# Patient Record
Sex: Female | Born: 1945 | Race: White | Hispanic: No | Smoking: Never smoker
Health system: Southern US, Community
[De-identification: ages and names within clinical notes are randomized; demographics above are authoritative.]

## PROBLEM LIST (undated history)

## (undated) DIAGNOSIS — C4359 Malignant melanoma of other part of trunk: Secondary | ICD-10-CM

## (undated) DIAGNOSIS — E785 Hyperlipidemia, unspecified: Secondary | ICD-10-CM

## (undated) DIAGNOSIS — H269 Unspecified cataract: Secondary | ICD-10-CM

## (undated) DIAGNOSIS — M810 Age-related osteoporosis without current pathological fracture: Secondary | ICD-10-CM

## (undated) HISTORY — DX: Age-related osteoporosis without current pathological fracture: M81.0

## (undated) HISTORY — DX: Unspecified cataract: H26.9

## (undated) HISTORY — DX: Hyperlipidemia, unspecified: E78.5

## (undated) HISTORY — DX: Malignant melanoma of other part of trunk: C43.59

---

## 1994-06-24 DIAGNOSIS — C4359 Malignant melanoma of other part of trunk: Secondary | ICD-10-CM

## 1994-06-24 HISTORY — DX: Malignant melanoma of other part of trunk: C43.59

## 1994-06-24 HISTORY — PX: MELANOMA EXCISION: SHX5266

## 1998-06-01 ENCOUNTER — Ambulatory Visit (HOSPITAL_COMMUNITY): Admission: RE | Admit: 1998-06-01 | Discharge: 1998-06-01 | Payer: Self-pay | Admitting: Gastroenterology

## 1999-04-27 ENCOUNTER — Encounter: Payer: Self-pay | Admitting: Emergency Medicine

## 1999-04-27 ENCOUNTER — Encounter: Admission: RE | Admit: 1999-04-27 | Discharge: 1999-04-27 | Payer: Self-pay | Admitting: Emergency Medicine

## 1999-11-12 ENCOUNTER — Encounter: Payer: Self-pay | Admitting: Emergency Medicine

## 1999-11-12 ENCOUNTER — Encounter: Admission: RE | Admit: 1999-11-12 | Discharge: 1999-11-12 | Payer: Self-pay | Admitting: Emergency Medicine

## 1999-11-18 ENCOUNTER — Emergency Department (HOSPITAL_COMMUNITY): Admission: EM | Admit: 1999-11-18 | Discharge: 1999-11-18 | Payer: Self-pay | Admitting: *Deleted

## 2000-11-12 ENCOUNTER — Encounter: Payer: Self-pay | Admitting: Emergency Medicine

## 2000-11-12 ENCOUNTER — Encounter: Admission: RE | Admit: 2000-11-12 | Discharge: 2000-11-12 | Payer: Self-pay | Admitting: Emergency Medicine

## 2001-11-13 ENCOUNTER — Encounter: Payer: Self-pay | Admitting: Emergency Medicine

## 2001-11-13 ENCOUNTER — Encounter: Admission: RE | Admit: 2001-11-13 | Discharge: 2001-11-13 | Payer: Self-pay | Admitting: Emergency Medicine

## 2002-10-08 ENCOUNTER — Ambulatory Visit (HOSPITAL_COMMUNITY): Admission: RE | Admit: 2002-10-08 | Discharge: 2002-10-08 | Payer: Self-pay | Admitting: Gastroenterology

## 2002-10-08 ENCOUNTER — Encounter (INDEPENDENT_AMBULATORY_CARE_PROVIDER_SITE_OTHER): Payer: Self-pay | Admitting: *Deleted

## 2002-11-15 ENCOUNTER — Encounter: Payer: Self-pay | Admitting: Emergency Medicine

## 2002-11-15 ENCOUNTER — Encounter: Admission: RE | Admit: 2002-11-15 | Discharge: 2002-11-15 | Payer: Self-pay | Admitting: Emergency Medicine

## 2003-11-16 ENCOUNTER — Encounter: Admission: RE | Admit: 2003-11-16 | Discharge: 2003-11-16 | Payer: Self-pay | Admitting: Emergency Medicine

## 2004-11-20 ENCOUNTER — Encounter: Admission: RE | Admit: 2004-11-20 | Discharge: 2004-11-20 | Payer: Self-pay | Admitting: Emergency Medicine

## 2005-12-04 ENCOUNTER — Encounter: Admission: RE | Admit: 2005-12-04 | Discharge: 2005-12-04 | Payer: Self-pay | Admitting: Emergency Medicine

## 2006-12-31 ENCOUNTER — Encounter: Admission: RE | Admit: 2006-12-31 | Discharge: 2006-12-31 | Payer: Self-pay | Admitting: Emergency Medicine

## 2010-11-09 NOTE — Op Note (Signed)
   NAME:  HALO, SHEVLIN                           ACCOUNT NO.:  [id number]   MEDICAL RECORD NO.:  [id number]                   PATIENT TYPE:  AMB   LOCATION:  ENDO                                 FACILITY:  [facility]   PHYSICIAN:  Anselmo Rod, M.D.               DATE OF BIRTH:  [birth year]   DATE OF PROCEDURE:  10/08/2002  DATE OF DISCHARGE:                                 OPERATIVE REPORT   PROCEDURE:  Colonoscopy with snare polypectomy x1.   ENDOSCOPIST:  Anselmo Rod, M.D.   INSTRUMENT USED:  Olympus video colonoscope.   INDICATION FOR PROCEDURE:  A 65 year old white female with a personal  history of adenomatous undergoing a repeat colonoscopy.  Rule out recurrent  polyps.   PREPROCEDURE PREPARATION:  Informed consent was procured from the patient.  The patient was fasted for eight hours prior to the procedure and prepped  with a bottle of magnesium citrate and a gallon of GoLYTELY the night prior  to the procedure.   PREPROCEDURE PHYSICAL:  VITAL SIGNS:  The patient had stable vital signs.  NECK:  Supple.  CHEST:  Clear to auscultation.  S1, S2 regular.  ABDOMEN:  Soft with normal bowel sounds.   DESCRIPTION OF PROCEDURE:  The patient was placed in the left lateral  decubitus position and sedated with 70 mg of Demerol and 7 mg of Versed  intravenously.  Once the patient was adequately sedate and maintained on low-  flow oxygen and continuous cardiac monitoring, the Olympus video colonoscope  was advanced from the rectum to the cecum without difficulty.  A small 5-6  mm sessile polyp was snared from 25 cm.  The rest of the colonic mucosa up  to the cecum appeared healthy.  No other masses, polyps, erosions,  ulcerations, diverticula, or hemorrhoids were seen.   IMPRESSION:  One small sessile polyp snared from 25 cm, otherwise normal  colonoscopy up to the cecum.   RECOMMENDATIONS:  1. Await pathology results.  2.     Avoid all nonsteroidals, including aspirin, for  the next four weeks.  3. Repeat colorectal cancer screening depending on the pathology results,     possibly in the next three to five years.  4. Outpatient follow-up on a p.r.n. basis.                                               Anselmo Rod, M.D.    JNM/MEDQ  D:  10/08/2002  T:  10/09/2002  Job:  [id number]   cc:   Reuben Likes, M.D.  [address]  [place]  [place] [address]  Fax: [phone]

## 2011-10-21 ENCOUNTER — Ambulatory Visit (INDEPENDENT_AMBULATORY_CARE_PROVIDER_SITE_OTHER): Payer: Federal, State, Local not specified - PPO | Admitting: Family Medicine

## 2011-10-21 ENCOUNTER — Encounter: Payer: Self-pay | Admitting: Family Medicine

## 2011-10-21 VITALS — BP 147/84 | HR 97 | Temp 98.3°F | Resp 16 | Ht 66.0 in | Wt 129.0 lb

## 2011-10-21 DIAGNOSIS — Z139 Encounter for screening, unspecified: Secondary | ICD-10-CM | POA: Insufficient documentation

## 2011-10-21 DIAGNOSIS — Z9889 Other specified postprocedural states: Secondary | ICD-10-CM

## 2011-10-21 DIAGNOSIS — N952 Postmenopausal atrophic vaginitis: Secondary | ICD-10-CM

## 2011-10-21 DIAGNOSIS — H269 Unspecified cataract: Secondary | ICD-10-CM | POA: Insufficient documentation

## 2011-10-21 DIAGNOSIS — H9319 Tinnitus, unspecified ear: Secondary | ICD-10-CM | POA: Insufficient documentation

## 2011-10-21 DIAGNOSIS — M949 Disorder of cartilage, unspecified: Secondary | ICD-10-CM

## 2011-10-21 DIAGNOSIS — Z85820 Personal history of malignant melanoma of skin: Secondary | ICD-10-CM | POA: Insufficient documentation

## 2011-10-21 DIAGNOSIS — M8580 Other specified disorders of bone density and structure, unspecified site: Secondary | ICD-10-CM | POA: Insufficient documentation

## 2011-10-21 DIAGNOSIS — Z0000 Encounter for general adult medical examination without abnormal findings: Secondary | ICD-10-CM

## 2011-10-21 LAB — CBC WITH DIFFERENTIAL/PLATELET
Eosinophils Absolute: 0 10*3/uL (ref 0.0–0.7)
Eosinophils Relative: 0 % (ref 0–5)
Lymphocytes Relative: 27 % (ref 12–46)
Lymphs Abs: 1.2 10*3/uL (ref 0.7–4.0)
Monocytes Absolute: 0.3 10*3/uL (ref 0.1–1.0)
Monocytes Relative: 6 % (ref 3–12)
RBC: 4.61 MIL/uL (ref 3.87–5.11)
WBC: 4.4 10*3/uL (ref 4.0–10.5)

## 2011-10-21 LAB — COMPREHENSIVE METABOLIC PANEL
ALT: 12 U/L (ref 0–35)
AST: 23 U/L (ref 0–37)
Alkaline Phosphatase: 52 U/L (ref 39–117)
CO2: 26 mEq/L (ref 19–32)
Calcium: 9.5 mg/dL (ref 8.4–10.5)
Glucose, Bld: 96 mg/dL (ref 70–99)
Sodium: 137 mEq/L (ref 135–145)
Total Bilirubin: 0.4 mg/dL (ref 0.3–1.2)
Total Protein: 6.6 g/dL (ref 6.0–8.3)

## 2011-10-21 LAB — LIPID PANEL
Total CHOL/HDL Ratio: 3.2 Ratio
Triglycerides: 54 mg/dL (ref <150)
VLDL: 11 mg/dL (ref 0–40)

## 2011-10-21 LAB — TSH: TSH: 1.68 u[IU]/mL (ref 0.350–4.500)

## 2011-10-21 NOTE — Progress Notes (Signed)
  Subjective:    Patient ID: Lauren Downs, female    DOB: Jan 15, 1946, 66 y.o.   MRN: [id number]  HPI  Patient presents for CPE  History of malignant melanoma- requests skin check; has not noted any moles that have  changed in size or color.  Continued sun exposure without sunscreen.  Tinnitus- intermittent symptoms;  Associated with hearing loss; no diagnosis of meniere's  Cataracts OU- Dr. Kallie Edward Maintenance:  Colonoscopy- 12/09 small polyp removed; repeat colonoscopy Was due this past December. Pap 11/10 Mammogram 02/2010; normal DEXA- osteopenia 5/03 Immunizations UTD   Review of Systems  See intake form(to be scanned)    Objective:   Physical Exam  Constitutional: She appears well-developed.  HENT:  Head: Normocephalic and atraumatic.  Right Ear: External ear normal.  Left Ear: External ear normal.  Mouth/Throat: Oropharynx is clear and moist.  Eyes: Conjunctivae and EOM are normal. Pupils are equal, round, and reactive to light.  Neck: Normal range of motion. Neck supple. No thyromegaly present.  Cardiovascular: Normal rate, regular rhythm and normal heart sounds.   Pulmonary/Chest: Effort normal and breath sounds normal.  Abdominal: Bowel sounds are normal.  Genitourinary: Rectum normal and uterus normal. No breast swelling, tenderness, discharge or bleeding. There is no lesion on the right labia. There is no lesion on the left labia. Right adnexum displays no mass. Left adnexum displays no mass. No erythema (atrophic changes) around the vagina. No vaginal discharge found.  Skin: Lesion: multiple lentigo.    EKG- NSR     Assessment & Plan:   1. Screening colonoscopy  Ambulatory referral to Gastroenterology  2. Routine general medical examination at a health care facility  EKG 12-Lead, CBC with Differential, Comprehensive metabolic panel, Lipid panel, TSH, Vitamin D 1,25 dihydroxy, Pap IG (Image Guided)  3. Tinnitus    4. History of melanoma excision      5. Cataracts, bilateral    6. Osteopenia    7. Atrophic vaginitis     Patient to schedule mammogram Anticipatory guidance

## 2011-10-23 LAB — PAP IG (IMAGE GUIDED)

## 2011-10-23 LAB — VITAMIN D 1,25 DIHYDROXY: Vitamin D2 1, 25 (OH)2: <8 pg/mL

## 2011-11-29 ENCOUNTER — Telehealth: Payer: Self-pay | Admitting: Radiology

## 2011-11-29 NOTE — Telephone Encounter (Signed)
Called pt per Dr Hal Hope, patient sent  Letter to ask about if she needs fecal blood testing done, per Dr Hal Hope she does not as long as she is current with her Colonoscopy,and she is also she was advise pf normal pap test

## 2012-09-24 ENCOUNTER — Encounter: Payer: Self-pay | Admitting: Family Medicine

## 2015-02-23 LAB — HM MAMMOGRAPHY

## 2015-02-25 ENCOUNTER — Ambulatory Visit (INDEPENDENT_AMBULATORY_CARE_PROVIDER_SITE_OTHER): Payer: Federal, State, Local not specified - PPO

## 2015-02-25 DIAGNOSIS — Z23 Encounter for immunization: Secondary | ICD-10-CM | POA: Diagnosis not present

## 2015-03-09 ENCOUNTER — Encounter (HOSPITAL_COMMUNITY): Payer: Self-pay | Admitting: *Deleted

## 2015-06-02 ENCOUNTER — Ambulatory Visit (INDEPENDENT_AMBULATORY_CARE_PROVIDER_SITE_OTHER): Payer: Federal, State, Local not specified - PPO | Admitting: Family Medicine

## 2015-06-02 ENCOUNTER — Encounter: Payer: Self-pay | Admitting: Family Medicine

## 2015-06-02 VITALS — BP 125/73 | HR 94 | Temp 98.4°F | Resp 16 | Ht 66.0 in | Wt 127.2 lb

## 2015-06-02 DIAGNOSIS — R319 Hematuria, unspecified: Secondary | ICD-10-CM

## 2015-06-02 DIAGNOSIS — Z131 Encounter for screening for diabetes mellitus: Secondary | ICD-10-CM | POA: Diagnosis not present

## 2015-06-02 DIAGNOSIS — M810 Age-related osteoporosis without current pathological fracture: Secondary | ICD-10-CM | POA: Diagnosis not present

## 2015-06-02 DIAGNOSIS — Z85820 Personal history of malignant melanoma of skin: Secondary | ICD-10-CM | POA: Diagnosis not present

## 2015-06-02 DIAGNOSIS — Z0000 Encounter for general adult medical examination without abnormal findings: Secondary | ICD-10-CM | POA: Diagnosis not present

## 2015-06-02 DIAGNOSIS — Z1159 Encounter for screening for other viral diseases: Secondary | ICD-10-CM

## 2015-06-02 DIAGNOSIS — Z13220 Encounter for screening for lipoid disorders: Secondary | ICD-10-CM | POA: Diagnosis not present

## 2015-06-02 DIAGNOSIS — Z8041 Family history of malignant neoplasm of ovary: Secondary | ICD-10-CM | POA: Diagnosis not present

## 2015-06-02 LAB — POC MICROSCOPIC URINALYSIS (UMFC)

## 2015-06-02 LAB — COMPREHENSIVE METABOLIC PANEL
ALBUMIN: 4.3 g/dL (ref 3.6–5.1)
ALK PHOS: 60 U/L (ref 33–130)
ALT: 17 U/L (ref 6–29)
AST: 23 U/L (ref 10–35)
BILIRUBIN TOTAL: 0.5 mg/dL (ref 0.2–1.2)
BUN: 19 mg/dL (ref 7–25)
CALCIUM: 9.2 mg/dL (ref 8.6–10.4)
CO2: 25 mmol/L (ref 20–31)
Chloride: 102 mmol/L (ref 98–110)
Creat: 0.76 mg/dL (ref 0.50–0.99)
GLUCOSE: 87 mg/dL (ref 65–99)
Potassium: 4.5 mmol/L (ref 3.5–5.3)
Sodium: 136 mmol/L (ref 135–146)
TOTAL PROTEIN: 6.7 g/dL (ref 6.1–8.1)

## 2015-06-02 LAB — POCT URINALYSIS DIP (MANUAL ENTRY)
Bilirubin, UA: NEGATIVE
Glucose, UA: NEGATIVE
Leukocytes, UA: NEGATIVE
Nitrite, UA: NEGATIVE
PH UA: 5.5
PROTEIN UA: NEGATIVE
SPEC GRAV UA: 1.025
Urobilinogen, UA: 0.2

## 2015-06-02 LAB — CBC WITH DIFFERENTIAL/PLATELET
BASOS ABS: 0 10*3/uL (ref 0.0–0.1)
Basophils Relative: 1 % (ref 0–1)
Eosinophils Absolute: 0 10*3/uL (ref 0.0–0.7)
Eosinophils Relative: 0 % (ref 0–5)
HEMATOCRIT: 42.7 % (ref 36.0–46.0)
HEMOGLOBIN: 14 g/dL (ref 12.0–15.0)
LYMPHS ABS: 1.3 10*3/uL (ref 0.7–4.0)
LYMPHS PCT: 36 % (ref 12–46)
MCH: 29.4 pg (ref 26.0–34.0)
MCHC: 32.8 g/dL (ref 30.0–36.0)
MCV: 89.5 fL (ref 78.0–100.0)
MPV: 11 fL (ref 8.6–12.4)
Monocytes Absolute: 0.3 10*3/uL (ref 0.1–1.0)
Monocytes Relative: 8 % (ref 3–12)
NEUTROS ABS: 2 10*3/uL (ref 1.7–7.7)
Neutrophils Relative %: 55 % (ref 43–77)
Platelets: 269 10*3/uL (ref 150–400)
RBC: 4.77 MIL/uL (ref 3.87–5.11)
RDW: 13 % (ref 11.5–15.5)
WBC: 3.7 10*3/uL — ABNORMAL LOW (ref 4.0–10.5)

## 2015-06-02 LAB — TSH: TSH: 2.17 u[IU]/mL (ref 0.350–4.500)

## 2015-06-02 LAB — LIPID PANEL
CHOLESTEROL: 253 mg/dL — AB (ref 125–200)
HDL: 95 mg/dL (ref >=46)
LDL Cholesterol: 147 mg/dL — ABNORMAL HIGH (ref <130)
Total CHOL/HDL Ratio: 2.7 Ratio (ref <=5.0)
Triglycerides: 53 mg/dL (ref <150)
VLDL: 11 mg/dL (ref <30)

## 2015-06-02 MED ORDER — ASPIRIN EC 81 MG PO TBEC: 81.0000 mg | DELAYED_RELEASE_TABLET | Freq: Every day | ORAL | Status: DC

## 2015-06-02 NOTE — Progress Notes (Signed)
Subjective:    Patient ID: Lauren Downs, female    DOB: [birth year], 69 y.o.   MRN: [id number]  06/02/2015  Annual Exam   HPI This 69 y.o. female presents to establish care and for Complete Physical Examination.  Last physical:  10-2013 Pap smear:  10-21-2011 Mammogram:  03-03-15  [facility] Colonoscopy:  12-16-2013 Bone density:  11-20-2013 TDAP:  2010 Pneumovax:  2009; Prevnar 2015 Zostavax:  2011 Influenza:  02/25/2015 Eye exam:  +glasses; 03/2015; +cataract; [place].  Refraction. Dental exam:  Every six months.  This week.   Melanoma L back: 1996; wide excision in hospital; no current dermatologist.     Review of Systems  Constitutional: Negative for fever, chills, diaphoresis, activity change, appetite change, fatigue and unexpected weight change.  HENT: Negative for congestion, dental problem, drooling, ear discharge, ear pain, facial swelling, hearing loss, mouth sores, nosebleeds, postnasal drip, rhinorrhea, sinus pressure, sneezing, sore throat, tinnitus, trouble swallowing and voice change.   Eyes: Negative for photophobia, pain, discharge, redness, itching and visual disturbance.  Respiratory: Negative for apnea, cough, choking, chest tightness, shortness of breath, wheezing and stridor.   Cardiovascular: Negative for chest pain, palpitations and leg swelling.  Gastrointestinal: Negative for nausea, vomiting, abdominal pain, diarrhea, constipation, blood in stool, abdominal distention, anal bleeding and rectal pain.  Endocrine: Negative for cold intolerance, heat intolerance, polydipsia, polyphagia and polyuria.  Genitourinary: Negative for dysuria, urgency, frequency, hematuria, flank pain, decreased urine volume, vaginal bleeding, vaginal discharge, enuresis, difficulty urinating, genital sores, vaginal pain, menstrual problem, pelvic pain and dyspareunia.  Musculoskeletal: Negative for myalgias, back pain, joint swelling, arthralgias, gait problem, neck pain and neck stiffness.    Skin: Negative for color change, pallor, rash and wound.  Allergic/Immunologic: Negative for environmental allergies, food allergies and immunocompromised state.  Neurological: Negative for dizziness, tremors, seizures, syncope, facial asymmetry, speech difficulty, weakness, light-headedness, numbness and headaches.  Hematological: Negative for adenopathy. Does not bruise/bleed easily.  Psychiatric/Behavioral: Negative for suicidal ideas, hallucinations, behavioral problems, confusion, sleep disturbance, self-injury, dysphoric mood, decreased concentration and agitation. The patient is not nervous/anxious and is not hyperactive.     Past Medical History  Diagnosis Date  . Cataract   . Osteoporosis   . Melanoma of back ([place]) 06/24/1994   Past Surgical History  Procedure Laterality Date  . Melanoma excision  06/24/1994   Allergies  Allergen Reactions  . Hydrocodone     rash   Current Outpatient Prescriptions  Medication Sig Dispense Refill  . aspirin EC 81 MG tablet Take 1 tablet (81 mg total) by mouth daily. 90 tablet 3   No current facility-administered medications for this visit.   Social History   Social History  . Marital Status: Single    Spouse Name: N/A  . Number of Children: N/A  . Years of Education: N/A   Occupational History  . Not on file.   Social History Main Topics  . Smoking status: Never Smoker   . Smokeless tobacco: Not on file  . Alcohol Use: No  . Drug Use: No  . Sexual Activity: Not on file   Other Topics Concern  . Not on file   Social History Narrative   Marital status: divorced since 25 years ago.  Not dating.       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.      ADLs: independent with ADLs; no assistant  devices.        Advanced Directives:  None; FULL CODE; no prolonged measures.         Family History  Problem Relation Age of Onset  . Cancer Mother 49    ovarian cancer  . Heart disease Father 3    CABG       Objective:    BP 125/73 mmHg  Pulse 94  Temp(Src) 98.4 F (36.9 C) (Oral)  Resp 16  Ht 5\' 6"  (1.676 m)  Wt 127 lb 3.2 oz (57.698 kg)  BMI 20.54 kg/m2 Physical Exam  Constitutional: She is oriented to person, place, and time. She appears well-developed and well-nourished. No distress.  HENT:  Head: Normocephalic and atraumatic.  Right Ear: External ear normal.  Left Ear: External ear normal.  Nose: Nose normal.  Mouth/Throat: Oropharynx is clear and moist.  Eyes: Conjunctivae and EOM are normal. Pupils are equal, round, and reactive to light.  Neck: Normal range of motion and full passive range of motion without pain. Neck supple. No JVD present. Carotid bruit is not present. No thyromegaly present.  Cardiovascular: Normal rate, regular rhythm and normal heart sounds.  Exam reveals no gallop and no friction rub.   No murmur heard. Pulmonary/Chest: Effort normal and breath sounds normal. She has no wheezes. She has no rales.  Abdominal: Soft. Bowel sounds are normal. She exhibits no distension and no mass. There is no tenderness. There is no rebound and no guarding.  Musculoskeletal:       Right shoulder: Normal.       Left shoulder: Normal.       Cervical back: Normal.  Lymphadenopathy:    She has no cervical adenopathy.  Neurological: She is alert and oriented to person, place, and time. She has normal reflexes. No cranial nerve deficit. She exhibits normal muscle tone. Coordination normal.  Skin: Skin is warm and dry. No rash noted. She is not diaphoretic. No erythema. No pallor.  Psychiatric: She has a normal mood and affect. Her behavior is normal. Judgment and thought content normal.  Nursing note and vitals reviewed.       Assessment & Plan:   1.  Routine physical examination   2. Screening, lipid   3. Screening for diabetes mellitus   4. Osteoporosis   5. History of melanoma   6. Family history of ovarian cancer   7. Need for hepatitis C screening test   8. Hematuria     Orders Placed This Encounter  Procedures  . CBC with Differential/Platelet  . Comprehensive metabolic panel    Order Specific Question:  Has the patient fasted?    Answer:  Yes  . Hemoglobin A1c  . Lipid panel    Order Specific Question:  Has the patient fasted?    Answer:  Yes  . TSH  . VITAMIN D 25 Hydroxy (Vit-D Deficiency, Fractures)  . Hepatitis C antibody  . POCT urinalysis dipstick  . POCT Microscopic Urinalysis ([facility])   Meds ordered this encounter  Medications  . DISCONTD: aspirin EC 81 MG tablet    Sig: Take 1 tablet (81 mg total) by mouth daily.    Dispense:  90 tablet    Refill:  3  .  aspirin EC 81 MG tablet    Sig: Take 1 tablet (81 mg total) by mouth daily.    Dispense:  90 tablet    Refill:  3    Return in about 1 year (around 06/01/2016) for complete physical examiniation.    Juwan Vences Elayne Guerin, M.D. [facility] [address] [place], [place]  [phone] phone [phone] fax

## 2015-06-02 NOTE — Patient Instructions (Signed)

## 2015-06-03 LAB — VITAMIN D 25 HYDROXY (VIT D DEFICIENCY, FRACTURES): VIT D 25 HYDROXY: 37 ng/mL (ref 30–100)

## 2015-06-03 LAB — HEMOGLOBIN A1C
HEMOGLOBIN A1C: 5.5 % (ref <5.7)
MEAN PLASMA GLUCOSE: 111 mg/dL (ref <117)

## 2015-06-03 LAB — HEPATITIS C ANTIBODY: HCV AB: NEGATIVE

## 2015-07-04 ENCOUNTER — Encounter: Payer: Self-pay | Admitting: Family Medicine

## 2016-05-01 ENCOUNTER — Encounter: Payer: Self-pay | Admitting: Family Medicine

## 2016-05-01 ENCOUNTER — Ambulatory Visit (INDEPENDENT_AMBULATORY_CARE_PROVIDER_SITE_OTHER): Payer: Federal, State, Local not specified - PPO | Admitting: Family Medicine

## 2016-05-01 VITALS — BP 122/82 | HR 94 | Temp 98.4°F | Resp 18 | Ht 66.0 in | Wt 127.2 lb

## 2016-05-01 DIAGNOSIS — Z8041 Family history of malignant neoplasm of ovary: Secondary | ICD-10-CM | POA: Diagnosis not present

## 2016-05-01 DIAGNOSIS — Z0000 Encounter for general adult medical examination without abnormal findings: Secondary | ICD-10-CM

## 2016-05-01 DIAGNOSIS — Z85820 Personal history of malignant melanoma of skin: Secondary | ICD-10-CM

## 2016-05-01 DIAGNOSIS — Z131 Encounter for screening for diabetes mellitus: Secondary | ICD-10-CM | POA: Diagnosis not present

## 2016-05-01 DIAGNOSIS — M8589 Other specified disorders of bone density and structure, multiple sites: Secondary | ICD-10-CM

## 2016-05-01 DIAGNOSIS — Z9889 Other specified postprocedural states: Secondary | ICD-10-CM

## 2016-05-01 DIAGNOSIS — Z1329 Encounter for screening for other suspected endocrine disorder: Secondary | ICD-10-CM

## 2016-05-01 DIAGNOSIS — Z87891 Personal history of nicotine dependence: Secondary | ICD-10-CM | POA: Diagnosis not present

## 2016-05-01 DIAGNOSIS — E7800 Pure hypercholesterolemia, unspecified: Secondary | ICD-10-CM | POA: Diagnosis not present

## 2016-05-01 LAB — POCT URINALYSIS DIP (MANUAL ENTRY)
BILIRUBIN UA: NEGATIVE
GLUCOSE UA: NEGATIVE
Ketones, POC UA: NEGATIVE
LEUKOCYTES UA: NEGATIVE
NITRITE UA: NEGATIVE
Protein Ur, POC: NEGATIVE
Spec Grav, UA: 1.01
Urobilinogen, UA: 0.2
pH, UA: 7

## 2016-05-01 MED ORDER — ASPIRIN EC 81 MG PO TBEC: 81.0000 mg | DELAYED_RELEASE_TABLET | Freq: Every day | ORAL | 3 refills | Status: DC

## 2016-05-01 NOTE — Patient Instructions (Addendum)
   IF you received an x-ray today, you will receive an invoice from [facility]. Please contact [facility] at [phone] with questions or concerns regarding your invoice.   IF you received labwork today, you will receive an invoice from [facility]. Please contact [facility] at [phone] with questions or concerns regarding your invoice.   Our billing staff will not be able to assist you with questions regarding bills from these companies.  You will be contacted with the lab results as soon as they are available. The fastest way to get your results is to activate your My Chart account. Instructions are located on the last page of this paperwork. If you have not heard from us regarding the results in 2 weeks, please contact this office.    Keeping You Healthy  Get These Tests  Blood Pressure- Have your blood pressure checked by your healthcare provider at least once a year.  Normal blood pressure is 120/80.  Weight- Have your body mass index (BMI) calculated to screen for obesity.  BMI is a measure of body fat based on height and weight.  You can calculate your own BMI at www.nhlbisupport.com/bmi/  Cholesterol- Have your cholesterol checked every year.  Diabetes- Have your blood sugar checked every year if you have high blood pressure, high cholesterol, a family history of diabetes or if you are overweight.  Pap Test - Have a pap test every 1 to 5 years if you have been sexually active.  If you are older than 65 and recent pap tests have been normal you may not need additional pap tests.  In addition, if you have had a hysterectomy  for benign disease additional pap tests are not necessary.  Mammogram-Yearly mammograms are essential for early detection of breast cancer  Screening for Colon Cancer- Colonoscopy starting at age 50. Screening may begin sooner depending on your family history and other health conditions.  Follow up colonoscopy  as directed by your Gastroenterologist.  Screening for Osteoporosis- Screening begins at age 65 with bone density scanning, sooner if you are at higher risk for developing Osteoporosis.  Get these medicines  Calcium with Vitamin D- Your body requires 1200-1500 mg of Calcium a day and 800-1000 IU of Vitamin D a day.  You can only absorb 500 mg of Calcium at a time therefore Calcium must be taken in 2 or 3 separate doses throughout the day.  Hormones- Hormone therapy has been associated with increased risk for certain cancers and heart disease.  Talk to your healthcare provider about if you need relief from menopausal symptoms.  Aspirin- Ask your healthcare provider about taking Aspirin to prevent Heart Disease and Stroke.  Get these Immuniztions  Flu shot- Every fall  Pneumonia shot- Once after the age of 65; if you are younger ask your healthcare provider if you need a pneumonia shot.  Tetanus- Every ten years.  Zostavax- Once after the age of 60 to prevent shingles.  Take these steps  Don't smoke- Your healthcare provider can help you quit. For tips on how to quit, ask your healthcare provider or go to www.smokefree.gov or call 1-800 QUIT-NOW.  Be physically active- Exercise 5 days a week for a minimum of 30 minutes.  If you are not already physically active, start slow and gradually work up to 30 minutes of moderate physical activity.  Try walking, dancing, bike riding, swimming, etc.  Eat a healthy diet- Eat a variety of healthy foods such as fruits, vegetables, whole   grains, low fat milk, low fat cheeses, yogurt, lean meats, chicken, fish, eggs, dried beans, tofu, etc.  For more information go to www.thenutritionsource.org  Dental visit- Brush and floss teeth twice daily; visit your dentist twice a year.  Eye exam- Visit your Optometrist or Ophthalmologist yearly.  Drink alcohol in moderation- Limit alcohol intake to one drink or less a day.  Never drink and  drive.  Depression- Your emotional health is as important as your physical health.  If you're feeling down or losing interest in things you normally enjoy, please talk to your healthcare provider.  Seat Belts- can save your life; always wear one  Smoke/Carbon Monoxide detectors- These detectors need to be installed on the appropriate level of your home.  Replace batteries at least once a year.  Violence- If anyone is threatening or hurting you, please tell your healthcare provider.  Living Will/ Health care power of attorney- Discuss with your healthcare provider and family. 

## 2016-05-01 NOTE — Progress Notes (Signed)
Subjective:    Patient ID: Lauren Downs, female    DOB: 1945/09/05, 70 y.o.   MRN: [id number]  05/01/2016  Annual Exam (CPE)   HPI This 70 y.o. female presents for Annual Wellness Examination.  Last physical: 06-02-2015 Pap smear:  09-2011 WNL.  All normal pap smears throughout life.  Repeat once.   Mammogram:  02/28/2016; 3D; [facility]. Colonoscopy:   2015 Bone density:  2006; previous Actonel x 5 years. TDAP: not sure Eye exam:  [facility] Pronto with Bing Plume 05/2015; +glasses Dental exam:  3 times per year.  Due for 05/2016.  Immunization History  Administered Date(s) Administered  . Influenza,inj,Quad PF,36+ Mos 02/25/2015  . Influenza-Unspecified 02/26/2016  . Pneumococcal Conjugate-13 06/24/2013  . Pneumococcal Polysaccharide-23 03/23/2016  . Pneumococcal-Unspecified 06/25/2007  . Tdap 06/24/2008  . Zoster 06/24/2009    BP Readings from Last 3 Encounters:  05/01/16 122/82  06/02/15 125/73  10/21/11 (!) 147/84   Wt Readings from Last 3 Encounters:  05/01/16 127 lb 3.2 oz (57.7 kg)  06/02/15 127 lb 3.2 oz (57.7 kg)  10/21/11 129 lb (58.5 kg)   Tobacco smoking: smoked x 25 years; quit for 25 years.   Review of Systems  Constitutional: Negative for activity change, appetite change, chills, diaphoresis, fatigue, fever and unexpected weight change.  HENT: Negative for congestion, dental problem, drooling, ear discharge, ear pain, facial swelling, hearing loss, mouth sores, nosebleeds, postnasal drip, rhinorrhea, sinus pressure, sneezing, sore throat, tinnitus, trouble swallowing and voice change.   Eyes: Negative for photophobia, pain, discharge, redness, itching and visual disturbance.  Respiratory: Negative for apnea, cough, choking, chest tightness, shortness of breath, wheezing and stridor.   Cardiovascular: Negative for chest pain, palpitations and leg swelling.  Gastrointestinal: Negative for abdominal distention, abdominal pain, anal bleeding, blood in stool, constipation,  diarrhea, nausea, rectal pain and vomiting.  Endocrine: Negative for cold intolerance, heat intolerance, polydipsia, polyphagia and polyuria.  Genitourinary: Negative for decreased urine volume, difficulty urinating, dyspareunia, dysuria, enuresis, flank pain, frequency, genital sores, hematuria, menstrual problem, pelvic pain, urgency, vaginal bleeding, vaginal discharge and vaginal pain.  Musculoskeletal: Negative for arthralgias, back pain, gait problem, joint swelling, myalgias, neck pain and neck stiffness.  Skin: Negative for color change, pallor, rash and wound.  Allergic/Immunologic: Negative for environmental allergies, food allergies and immunocompromised state.  Neurological: Negative for dizziness, tremors, seizures, syncope, facial asymmetry, speech difficulty, weakness, light-headedness, numbness and headaches.  Hematological: Negative for adenopathy. Does not bruise/bleed easily.  Psychiatric/Behavioral: Negative for agitation, behavioral problems, confusion, decreased concentration, dysphoric mood, hallucinations, self-injury, sleep disturbance and suicidal ideas. The patient is not nervous/anxious and is not hyperactive.     Past Medical History:  Diagnosis Date  . Cataract   . Hyperlipidemia   . 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 Osteoporosis    Past Surgical History:  Procedure Laterality Date  . MELANOMA EXCISION  06/24/1994   Allergies  Allergen Reactions  . Hydrocodone     rash   Current Outpatient Prescriptions  Medication Sig Dispense Refill  . aspirin EC 81 MG tablet Take 1 tablet (81 mg total) by mouth daily. 90 tablet 3   No current facility-administered medications for this visit.    Social History   Social History  . Marital status: Single    Spouse name: N/A  . Number of children: N/A  . Years of education: N/A   Occupational History  . Not on file.   Social History Main Topics  . Smoking status: Never Smoker  .  Smokeless tobacco: Not on file    . Alcohol use No  . Drug use: No  . Sexual activity: Not on file   Other Topics Concern  . Not on file   Social History Narrative   Marital status: divorced since 25 years ago.  Not dating in 2017; not interested.       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      ADLs: independent with ADLs; no assistant devices.        Advanced Directives:  None; FULL CODE; no prolonged measures.         Family History  Problem Relation Age of Onset  . Cancer Mother 49    ovarian cancer  . Heart disease Father 64    CABG       Objective:    BP 122/82   Pulse 94   Temp 98.4 F (36.9 C) (Oral)   Resp 18   Ht '5\' 6"'$  (1.676 m)   Wt 127 lb 3.2 oz (57.7 kg)   SpO2 98%   BMI 20.53 kg/m  Physical Exam  Constitutional: She is oriented to person, place, and time. She appears well-developed and well-nourished. No distress.  HENT:  Head: Normocephalic and atraumatic.  Right Ear: External ear normal.  Left Ear: External ear normal.  Nose: Nose normal.  Mouth/Throat: Oropharynx is clear and moist.  Eyes: Conjunctivae and EOM are normal. Pupils are equal, round, and reactive to light.  Neck: Normal range of motion and full passive range of motion without pain. Neck supple. No JVD present. Carotid bruit is not present. No thyromegaly present.  Cardiovascular: Normal rate, regular rhythm and normal heart sounds.  Exam reveals no gallop and no friction rub.   No murmur heard. Pulmonary/Chest: Effort normal and breath sounds normal. She has no wheezes. She has no rales. Right breast exhibits no inverted nipple, no mass, no nipple discharge, no skin change and no tenderness. Left breast exhibits no inverted nipple, no mass, no nipple discharge, no skin change and  no tenderness. Breasts are symmetrical.  Abdominal: Soft. Bowel sounds are normal. She exhibits no distension and no mass. There is no tenderness. There is no rebound and no guarding.  Genitourinary:  Genitourinary Comments: Pt declined.  Musculoskeletal:       Right shoulder: Normal.       Left shoulder: Normal.       Cervical back: Normal.  Lymphadenopathy:    She has no cervical adenopathy.  Neurological: She is alert and oriented to person, place, and time. She has normal reflexes. No cranial nerve deficit. She exhibits normal muscle tone. Coordination normal.  Skin: Skin is warm and dry. No rash noted. She is not diaphoretic. No erythema. No pallor.  Psychiatric: She has a normal mood and affect. Her behavior is normal. Judgment and thought content normal.  Nursing note and vitals reviewed.  Results for orders placed or performed in visit on 05/01/16  CBC with Differential/Platelet  Result Value Ref Range   WBC 4.7 3.8 - 10.8 K/uL   RBC 4.59 3.80 - 5.10 MIL/uL   Hemoglobin 13.7  11.7 - 15.5 g/dL   HCT 41.0 35.0 - 45.0 %   MCV 89.3 80.0 - 100.0 fL   MCH 29.8 27.0 - 33.0 pg   MCHC 33.4 32.0 - 36.0 g/dL   RDW 13.0 11.0 - 15.0 %   Platelets 249 140 - 400 K/uL   MPV 10.7 7.5 - 12.5 fL   Neutro Abs 2,914 1,500 - 7,800 cells/uL   Lymphs Abs 1,269 850 - 3,900 cells/uL   Monocytes Absolute 517 200 - 950 cells/uL   Eosinophils Absolute 0 (L) 15 - 500 cells/uL   Basophils Absolute 0 0 - 200 cells/uL   Neutrophils Relative % 62 %   Lymphocytes Relative 27 %   Monocytes Relative 11 %   Eosinophils Relative 0 %   Basophils Relative 0 %   Smear Review Criteria for review not met   Comprehensive metabolic panel  Result Value Ref Range   Sodium 136 135 - 146 mmol/L   Potassium 4.1 3.5 - 5.3 mmol/L   Chloride 101 98 - 110 mmol/L   CO2 24 20 - 31 mmol/L   Glucose, Bld 88 65 - 99 mg/dL   BUN 19 7 - 25 mg/dL   Creat 0.81 0.50 - 0.99 mg/dL   Total Bilirubin 0.5 0.2 - 1.2 mg/dL    Alkaline Phosphatase 49 33 - 130 U/L   AST 22 10 - 35 U/L   ALT 17 6 - 29 U/L   Total Protein 6.6 6.1 - 8.1 g/dL   Albumin 4.3 3.6 - 5.1 g/dL   Calcium 9.1 8.6 - 10.4 mg/dL  Lipid panel  Result Value Ref Range   Cholesterol 241 (H) <200 mg/dL   Triglycerides 68 <150 mg/dL   HDL 82 >50 mg/dL   Total CHOL/HDL Ratio 2.9 <5.0 Ratio   VLDL 14 <30 mg/dL   LDL Cholesterol 145 (H) mg/dL  TSH  Result Value Ref Range   TSH 2.34 mIU/L  POCT urinalysis dipstick  Result Value Ref Range   Color, UA yellow yellow   Clarity, UA clear clear   Glucose, UA negative negative   Bilirubin, UA negative negative   Ketones, POC UA negative negative   Spec Grav, UA 1.010    Blood, UA trace-lysed (A) negative   pH, UA 7.0    Protein Ur, POC negative negative   Urobilinogen, UA 0.2    Nitrite, UA Negative Negative   Leukocytes, UA Negative Negative   Depression screen [facility] 2/9 05/01/2016 06/02/2015  Decreased Interest 0 0  Down, Depressed, Hopeless 0 0  PHQ - 2 Score 0 0   Fall Risk  05/01/2016 06/02/2015  Falls in the past year? No No       Assessment & Plan:   1. Routine physical examination   2. Osteopenia of multiple sites   3. History of melanoma excision   4. History of tobacco abuse   5. Hypercholesterolemia   6. Screening for thyroid disorder   7. Screening for diabetes mellitus   8. Family history of ovarian cancer    -anticipatory guidance --- exercise, weight maintenance, three servings of calcium daily. -obtain age appropriate screening labs. -recommend regular annual dermatology evaluation due to history of melanoma; pt declines. -pt s/p Actonel for five years.  -independent with ADLs.  Orders Placed This Encounter  Procedures  . CBC with Differential/Platelet  . Comprehensive metabolic panel    Order Specific Question:   Has the patient fasted?    Answer:   Yes  .  Lipid panel    Order Specific Question:   Has the patient fasted?    Answer:   Yes  . TSH  . POCT  urinalysis dipstick  . EKG 12-Lead   Meds ordered this encounter  Medications  . aspirin EC 81 MG tablet    Sig: Take 1 tablet (81 mg total) by mouth daily.    Dispense:  90 tablet    Refill:  3    Return in about 1 year (around 05/01/2017) for complete physical examiniation.   Mercer Stallworth Elayne Guerin, M.D. [facility] [address] [place], [place]  [address] [phone] phone [phone] fax

## 2016-05-02 LAB — CBC WITH DIFFERENTIAL/PLATELET
BASOS PCT: 0 %
Basophils Absolute: 0 cells/uL (ref 0–200)
EOS ABS: 0 {cells}/uL — AB (ref 15–500)
Eosinophils Relative: 0 %
HEMATOCRIT: 41 % (ref 35.0–45.0)
HEMOGLOBIN: 13.7 g/dL (ref 11.7–15.5)
LYMPHS ABS: 1269 {cells}/uL (ref 850–3900)
Lymphocytes Relative: 27 %
MCH: 29.8 pg (ref 27.0–33.0)
MCHC: 33.4 g/dL (ref 32.0–36.0)
MCV: 89.3 fL (ref 80.0–100.0)
MONO ABS: 517 {cells}/uL (ref 200–950)
MPV: 10.7 fL (ref 7.5–12.5)
Monocytes Relative: 11 %
NEUTROS PCT: 62 %
Neutro Abs: 2914 cells/uL (ref 1500–7800)
Platelets: 249 10*3/uL (ref 140–400)
RBC: 4.59 MIL/uL (ref 3.80–5.10)
RDW: 13 % (ref 11.0–15.0)
WBC: 4.7 10*3/uL (ref 3.8–10.8)

## 2016-05-02 LAB — COMPREHENSIVE METABOLIC PANEL
ALBUMIN: 4.3 g/dL (ref 3.6–5.1)
ALK PHOS: 49 U/L (ref 33–130)
ALT: 17 U/L (ref 6–29)
AST: 22 U/L (ref 10–35)
BILIRUBIN TOTAL: 0.5 mg/dL (ref 0.2–1.2)
BUN: 19 mg/dL (ref 7–25)
CALCIUM: 9.1 mg/dL (ref 8.6–10.4)
CO2: 24 mmol/L (ref 20–31)
Chloride: 101 mmol/L (ref 98–110)
Creat: 0.81 mg/dL (ref 0.50–0.99)
Glucose, Bld: 88 mg/dL (ref 65–99)
POTASSIUM: 4.1 mmol/L (ref 3.5–5.3)
Sodium: 136 mmol/L (ref 135–146)
Total Protein: 6.6 g/dL (ref 6.1–8.1)

## 2016-05-02 LAB — LIPID PANEL
Cholesterol: 241 mg/dL — ABNORMAL HIGH (ref <200)
HDL: 82 mg/dL (ref >50)
LDL Cholesterol: 145 mg/dL — ABNORMAL HIGH
TRIGLYCERIDES: 68 mg/dL (ref <150)
Total CHOL/HDL Ratio: 2.9 Ratio (ref <5.0)
VLDL: 14 mg/dL (ref <30)

## 2016-05-02 LAB — TSH: TSH: 2.34 mIU/L

## 2016-05-20 ENCOUNTER — Encounter: Payer: Self-pay | Admitting: Family Medicine

## 2016-05-20 DIAGNOSIS — Z8041 Family history of malignant neoplasm of ovary: Secondary | ICD-10-CM | POA: Insufficient documentation

## 2017-05-30 ENCOUNTER — Other Ambulatory Visit: Payer: Self-pay | Admitting: Family Medicine

## 2017-10-15 ENCOUNTER — Other Ambulatory Visit: Payer: Self-pay

## 2017-10-15 ENCOUNTER — Encounter: Payer: Self-pay | Admitting: Family Medicine

## 2017-10-15 ENCOUNTER — Ambulatory Visit (INDEPENDENT_AMBULATORY_CARE_PROVIDER_SITE_OTHER): Payer: Federal, State, Local not specified - PPO | Admitting: Family Medicine

## 2017-10-15 VITALS — BP 140/80 | HR 98 | Temp 98.0°F | Resp 16 | Ht 66.14 in | Wt 127.0 lb

## 2017-10-15 DIAGNOSIS — Z8041 Family history of malignant neoplasm of ovary: Secondary | ICD-10-CM | POA: Diagnosis not present

## 2017-10-15 DIAGNOSIS — C760 Malignant neoplasm of head, face and neck: Secondary | ICD-10-CM

## 2017-10-15 DIAGNOSIS — Z131 Encounter for screening for diabetes mellitus: Secondary | ICD-10-CM | POA: Diagnosis not present

## 2017-10-15 DIAGNOSIS — Z9889 Other specified postprocedural states: Secondary | ICD-10-CM | POA: Diagnosis not present

## 2017-10-15 DIAGNOSIS — Z0000 Encounter for general adult medical examination without abnormal findings: Secondary | ICD-10-CM

## 2017-10-15 DIAGNOSIS — M8589 Other specified disorders of bone density and structure, multiple sites: Secondary | ICD-10-CM

## 2017-10-15 DIAGNOSIS — Z87891 Personal history of nicotine dependence: Secondary | ICD-10-CM

## 2017-10-15 DIAGNOSIS — E7800 Pure hypercholesterolemia, unspecified: Secondary | ICD-10-CM | POA: Diagnosis not present

## 2017-10-15 DIAGNOSIS — Z1231 Encounter for screening mammogram for malignant neoplasm of breast: Secondary | ICD-10-CM | POA: Diagnosis not present

## 2017-10-15 DIAGNOSIS — Z136 Encounter for screening for cardiovascular disorders: Secondary | ICD-10-CM

## 2017-10-15 DIAGNOSIS — Z85820 Personal history of malignant melanoma of skin: Secondary | ICD-10-CM | POA: Diagnosis not present

## 2017-10-15 DIAGNOSIS — Z1239 Encounter for other screening for malignant neoplasm of breast: Secondary | ICD-10-CM

## 2017-10-15 LAB — POCT URINALYSIS DIP (MANUAL ENTRY)
BILIRUBIN UA: NEGATIVE mg/dL
Bilirubin, UA: NEGATIVE
Glucose, UA: NEGATIVE mg/dL
Leukocytes, UA: NEGATIVE
Nitrite, UA: NEGATIVE
Protein Ur, POC: NEGATIVE mg/dL
SPEC GRAV UA: 1.01 (ref 1.010–1.025)
UROBILINOGEN UA: 0.2 U/dL
pH, UA: 6.5 (ref 5.0–8.0)

## 2017-10-15 NOTE — Patient Instructions (Addendum)
We recommend that you schedule a mammogram for breast cancer screening. Typically, you do not need a referral to do this. Please contact a local imaging center to schedule your mammogram.  [facility] - [phone]  *ask for the Radiology [place] ([place]) - [phone] or [phone]  [facility] - [phone] [place] [phone] [facility] - [phone]  *ask for the [facility] - [phone]  *ask for the Radiology Department [facility] - [phone]  *ask for the [place] - [phone]     Preventive Care 70 Years and Older, Female Preventive care refers to lifestyle choices and visits with your health care provider that can promote health and wellness. What does preventive care include?  A yearly physical exam. This is also called an annual well check.  Dental exams once or twice a year.  Routine eye exams. Ask your health care provider how often you should have your eyes checked.  Personal lifestyle choices, including: ? Daily care of your teeth and gums. ? Regular physical activity. ? Eating a healthy diet. ? Avoiding tobacco and drug use. ? Limiting alcohol use. ? Practicing safe sex. ? Taking low-dose aspirin every day. ? Taking vitamin and mineral supplements as recommended by your health care provider. What happens during an annual well check? The services and screenings done by your health care provider during your annual well check will depend on your age, overall health, lifestyle risk factors, and family history of disease. Counseling Your health care provider may ask you questions about your:  Alcohol use.  Tobacco use.  Drug use.  Emotional well-being.  Home and relationship well-being.  Sexual activity.  Eating habits.  History of falls.  Memory and ability to  understand (cognition).  Work and work Statistician.  Reproductive health.  Screening You may have the following tests or measurements:  Height, weight, and BMI.  Blood pressure.  Lipid and cholesterol levels. These may be checked every 5 years, or more frequently if you are over 70 years old.  Skin check.  Lung cancer screening. You may have this screening every year starting at age 45 if you have a 30-pack-year history of smoking and currently smoke or have quit within the past 15 years.  Fecal occult blood test (FOBT) of the stool. You may have this test every year starting at age 50.  Flexible sigmoidoscopy or colonoscopy. You may have a sigmoidoscopy every 5 years or a colonoscopy every 10 years starting at age 34.  Hepatitis C blood test.  Hepatitis B blood test.  Sexually transmitted disease (STD) testing.  Diabetes screening. This is done by checking your blood sugar (glucose) after you have not eaten for a while (fasting). You may have this done every 1-3 years.  Bone density scan. This is done to screen for osteoporosis. You may have this done starting at age 23.  Mammogram. This may be done every 1-2 years. Talk to your health care provider about how often you should have regular mammograms.  Talk with your health care provider about your test results, treatment options, and if necessary, the need for more tests. Vaccines Your health care provider may recommend certain vaccines, such as:  Influenza vaccine. This is recommended every year.  Tetanus, diphtheria, and acellular pertussis (Tdap, Td) vaccine. You may need a Td booster every 10 years.  Varicella vaccine. You may need this  if you have not been vaccinated.  Zoster vaccine. You may need this after age 8.  Measles, mumps, and rubella (MMR) vaccine. You may need at least one dose of MMR if you were born in 1957 or later. You may also need a second dose.  Pneumococcal 13-valent conjugate (PCV13)  vaccine. One dose is recommended after age 53.  Pneumococcal polysaccharide (PPSV23) vaccine. One dose is recommended after age 32.  Meningococcal vaccine. You may need this if you have certain conditions.  Hepatitis A vaccine. You may need this if you have certain conditions or if you travel or work in places where you may be exposed to hepatitis A.  Hepatitis B vaccine. You may need this if you have certain conditions or if you travel or work in places where you may be exposed to hepatitis B.  Haemophilus influenzae type b (Hib) vaccine. You may need this if you have certain conditions.  Talk to your health care provider about which screenings and vaccines you need and how often you need them. This information is not intended to replace advice given to you by your health care provider. Make sure you discuss any questions you have with your health care provider. Document Released: 07/07/2015 Document Revised: 02/28/2016 Document Reviewed: 04/11/2015 Elsevier Interactive Patient Education  2018 [employer].  IF you received an x-ray today, you will receive an invoice from [facility]. Please contact [facility] at [phone] with questions or concerns regarding your invoice.   IF you received labwork today, you will receive an invoice from [facility]. Please contact [facility] at [phone] with questions or concerns regarding your invoice.   Our billing staff will not be able to assist you with questions regarding bills from these companies.  You will be contacted with the lab results as soon as they are available. The fastest way to get your results is to activate your My Chart account. Instructions are located on the last page of this paperwork. If you have not heard from [place] regarding the results in 2 weeks, please contact this office.      Health Maintenance, Female Adopting a healthy lifestyle and getting preventive care can go a long way to promote health and  wellness. Talk with your health care provider about what schedule of regular examinations is right for you. This is a good chance for you to check in with your provider about disease prevention and staying healthy. In between checkups, there are plenty of things you can do on your own. Experts have done a lot of research about which lifestyle changes and preventive measures are most likely to keep you healthy. Ask your health care provider for more information. Weight and diet Eat a healthy diet  Be sure to include plenty of vegetables, fruits, low-fat dairy products, and lean protein.  Do not eat a lot of foods high in solid fats, added sugars, or salt.  Get regular exercise. This is one of the most important things you can do for your health. ? Most adults should exercise for at least 150 minutes each week. The exercise should increase your heart rate and make you sweat (moderate-intensity exercise). ? Most adults should also do strengthening exercises at least twice a week. This is in addition to the moderate-intensity exercise.  Maintain a healthy weight  Body mass index (BMI) is a measurement that can be used to identify possible weight problems. It estimates body fat based on height and weight. Your health care provider can help determine your  BMI and help you achieve or maintain a healthy weight.  For females 66 years of age and older: ? A BMI below 18.5 is considered underweight. ? A BMI of 18.5 to 24.9 is normal. ? A BMI of 25 to 29.9 is considered overweight. ? A BMI of 30 and above is considered obese.  Watch levels of cholesterol and blood lipids  You should start having your blood tested for lipids and cholesterol at 70 years of age, then have this test every 5 years.  You may need to have your cholesterol levels checked more often if: ? Your lipid or cholesterol levels are high. ? You are older than 70 years of age. ? You are at high risk for heart disease.  Cancer  screening Lung Cancer  Lung cancer screening is recommended for adults 68-15 years old who are at high risk for lung cancer because of a history of smoking.  A yearly low-dose CT scan of the lungs is recommended for people who: ? Currently smoke. ? Have quit within the past 15 years. ? Have at least a 30-pack-year history of smoking. A pack year is smoking an average of one pack of cigarettes a day for 1 year.  Yearly screening should continue until it has been 15 years since you quit.  Yearly screening should stop if you develop a health problem that would prevent you from having lung cancer treatment.  Breast Cancer  Practice breast self-awareness. This means understanding how your breasts normally appear and feel.  It also means doing regular breast self-exams. Let your health care provider know about any changes, no matter how small.  If you are in your 20s or 30s, you should have a clinical breast exam (CBE) by a health care provider every 1-3 years as part of a regular health exam.  If you are 104 or older, have a CBE every year. Also consider having a breast X-ray (mammogram) every year.  If you have a family history of breast cancer, talk to your health care provider about genetic screening.  If you are at high risk for breast cancer, talk to your health care provider about having an MRI and a mammogram every year.  Breast cancer gene (BRCA) assessment is recommended for women who have family members with BRCA-related cancers. BRCA-related cancers include: ? Breast. ? Ovarian. ? Tubal. ? Peritoneal cancers.  Results of the assessment will determine the need for genetic counseling and BRCA1 and BRCA2 testing.  Cervical Cancer Your health care provider may recommend that you be screened regularly for cancer of the pelvic organs (ovaries, uterus, and vagina). This screening involves a pelvic examination, including checking for microscopic changes to the surface of your cervix  (Pap test). You may be encouraged to have this screening done every 3 years, beginning at age 10.  For women ages 71-65, health care providers may recommend pelvic exams and Pap testing every 3 years, or they may recommend the Pap and pelvic exam, combined with testing for human papilloma virus (HPV), every 5 years. Some types of HPV increase your risk of cervical cancer. Testing for HPV may also be done on women of any age with unclear Pap test results.  Other health care providers may not recommend any screening for nonpregnant women who are considered low risk for pelvic cancer and who do not have symptoms. Ask your health care provider if a screening pelvic exam is right for you.  If you have had past treatment for cervical cancer  or a condition that could lead to cancer, you need Pap tests and screening for cancer for at least 20 years after your treatment. If Pap tests have been discontinued, your risk factors (such as having a new sexual partner) need to be reassessed to determine if screening should resume. Some women have medical problems that increase the chance of getting cervical cancer. In these cases, your health care provider may recommend more frequent screening and Pap tests.  Colorectal Cancer  This type of cancer can be detected and often prevented.  Routine colorectal cancer screening usually begins at 70 years of age and continues through 70 years of age.  Your health care provider may recommend screening at an earlier age if you have risk factors for colon cancer.  Your health care provider may also recommend using home test kits to check for hidden blood in the stool.  A small camera at the end of a tube can be used to examine your colon directly (sigmoidoscopy or colonoscopy). This is done to check for the earliest forms of colorectal cancer.  Routine screening usually begins at age 52.  Direct examination of the colon should be repeated every 5-10 years through 70 years  of age. However, you may need to be screened more often if early forms of precancerous polyps or small growths are found.  Skin Cancer  Check your skin from head to toe regularly.  Tell your health care provider about any new moles or changes in moles, especially if there is a change in a mole's shape or color.  Also tell your health care provider if you have a mole that is larger than the size of a pencil eraser.  Always use sunscreen. Apply sunscreen liberally and repeatedly throughout the day.  Protect yourself by wearing long sleeves, pants, a wide-brimmed hat, and sunglasses whenever you are outside.  Heart disease, diabetes, and high blood pressure  High blood pressure causes heart disease and increases the risk of stroke. High blood pressure is more likely to develop in: ? People who have blood pressure in the high end of the normal range (130-139/85-89 mm Hg). ? People who are overweight or obese. ? People who are African American.  If you are 29-57 years of age, have your blood pressure checked every 3-5 years. If you are 34 years of age or older, have your blood pressure checked every year. You should have your blood pressure measured twice-once when you are at a hospital or clinic, and once when you are not at a hospital or clinic. Record the average of the two measurements. To check your blood pressure when you are not at a hospital or clinic, you can use: ? An automated blood pressure machine at a pharmacy. ? A home blood pressure monitor.  If you are between 82 years and 27 years old, ask your health care provider if you should take aspirin to prevent strokes.  Have regular diabetes screenings. This involves taking a blood sample to check your fasting blood sugar level. ? If you are at a normal weight and have a low risk for diabetes, have this test once every three years after 70 years of age. ? If you are overweight and have a high risk for diabetes, consider being tested  at a younger age or more often. Preventing infection Hepatitis B  If you have a higher risk for hepatitis B, you should be screened for this virus. You are considered at high risk for hepatitis B if: ?  You were born in a country where hepatitis B is common. Ask your health care provider which countries are considered high risk. ? Your parents were born in a high-risk country, and you have not been immunized against hepatitis B (hepatitis B vaccine). ? You have HIV or AIDS. ? You use needles to inject street drugs. ? You live with someone who has hepatitis B. ? You have had sex with someone who has hepatitis B. ? You get hemodialysis treatment. ? You take certain medicines for conditions, including cancer, organ transplantation, and autoimmune conditions.  Hepatitis C  Blood testing is recommended for: ? Everyone born from [birth year] through 1963. ? Anyone with known risk factors for hepatitis C.  Sexually transmitted infections (STIs)  You should be screened for sexually transmitted infections (STIs) including gonorrhea and chlamydia if: ? You are sexually active and are younger than 70 years of age. ? You are older than 70 years of age and your health care provider tells you that you are at risk for this type of infection. ? Your sexual activity has changed since you were last screened and you are at an increased risk for chlamydia or gonorrhea. Ask your health care provider if you are at risk.  If you do not have HIV, but are at risk, it may be recommended that you take a prescription medicine daily to prevent HIV infection. This is called pre-exposure prophylaxis (PrEP). You are considered at risk if: ? You are sexually active and do not regularly use condoms or know the HIV status of your partner(s). ? You take drugs by injection. ? You are sexually active with a partner who has HIV.  Talk with your health care provider about whether you are at high risk of being infected with HIV. If  you choose to begin PrEP, you should first be tested for HIV. You should then be tested every 3 months for as long as you are taking PrEP. Pregnancy  If you are premenopausal and you may become pregnant, ask your health care provider about preconception counseling.  If you may become pregnant, take 400 to 800 micrograms (mcg) of folic acid every day.  If you want to prevent pregnancy, talk to your health care provider about birth control (contraception). Osteoporosis and menopause  Osteoporosis is a disease in which the bones lose minerals and strength with aging. This can result in serious bone fractures. Your risk for osteoporosis can be identified using a bone density scan.  If you are 45 years of age or older, or if you are at risk for osteoporosis and fractures, ask your health care provider if you should be screened.  Ask your health care provider whether you should take a calcium or vitamin D supplement to lower your risk for osteoporosis.  Menopause may have certain physical symptoms and risks.  Hormone replacement therapy may reduce some of these symptoms and risks. Talk to your health care provider about whether hormone replacement therapy is right for you. Follow these instructions at home:  Schedule regular health, dental, and eye exams.  Stay current with your immunizations.  Do not use any tobacco products including cigarettes, chewing tobacco, or electronic cigarettes.  If you are pregnant, do not drink alcohol.  If you are breastfeeding, limit how much and how often you drink alcohol.  Limit alcohol intake to no more than 1 drink per day for nonpregnant women. One drink equals 12 ounces of beer, 5 ounces of wine, or 1 ounces of hard  liquor.  Do not use street drugs.  Do not share needles.  Ask your health care provider for help if you need support or information about quitting drugs.  Tell your health care provider if you often feel depressed.  Tell your  health care provider if you have ever been abused or do not feel safe at home. This information is not intended to replace advice given to you by your health care provider. Make sure you discuss any questions you have with your health care provider. Document Released: 12/24/2010 Document Revised: 11/16/2015 Document Reviewed: 03/14/2015 Elsevier Interactive Patient Education  2018 [place] (AHA) Exercise Recommendation  Being physically active is important to prevent heart disease and stroke, the nation's No. 1and No. 5killers. To improve overall cardiovascular health, we suggest at least 150 minutes per week of moderate exercise or 75 minutes per week of vigorous exercise (or a combination of moderate and vigorous activity). Thirty minutes a day, five times a week is an easy goal to remember. You will also experience benefits even if you divide your time into two or three segments of 10 to 15 minutes per day.  For people who would benefit from lowering their blood pressure or cholesterol, we recommend 40 minutes of aerobic exercise of moderate to vigorous intensity three to four times a week to lower the risk for heart attack and stroke.  Physical activity is anything that makes you move your body and burn calories.  This includes things like climbing stairs or playing sports. Aerobic exercises benefit your heart, and include walking, jogging, swimming or biking. Strength and stretching exercises are best for overall stamina and flexibility.  The simplest, positive change you can make to effectively improve your heart health is to start walking. It's enjoyable, free, easy, social and great exercise. A walking program is flexible and boasts high success rates because people can stick with it. It's easy for walking to become a regular and satisfying part of life.   For Overall Cardiovascular Health:  At least 30 minutes of moderate-intensity aerobic activity at  least 5 days per week for a total of 150  OR   At least 25 minutes of vigorous aerobic activity at least 3 days per week for a total of 75 minutes; or a combination of moderate- and vigorous-intensity aerobic activity  AND   Moderate- to high-intensity muscle-strengthening activity at least 2 days per week for additional health benefits.  For Lowering Blood Pressure and Cholesterol  An average 40 minutes of moderate- to vigorous-intensity aerobic activity 3 or 4 times per week  What if I can't make it to the time goal? Something is always better than nothing! And everyone has to start somewhere. Even if you've been sedentary for years, today is the day you can begin to make healthy changes in your life. If you don't think you'll make it for 30 or 40 minutes, set a reachable goal for today. You can work up toward your overall goal by increasing your time as you get stronger. Don't let all-or-nothing thinking rob you of doing what you can every day.  Source:http://www.heart.org

## 2017-10-15 NOTE — Progress Notes (Signed)
Subjective:    Patient ID: Lauren Downs, female    DOB: 12-Oct-1945, 72 y.o.   MRN: [id number]  10/15/2017  Annual Exam    HPI This 72 y.o. female presents for complete physical examination.   Visual Acuity Screening   Right eye Left eye Both eyes  Without correction:     With correction: 20/20 20/20 20/20     BP Readings from Last 3 Encounters:  10/15/17 140/80  05/01/16 122/82  06/02/15 125/73   Wt Readings from Last 3 Encounters:  10/15/17 127 lb (57.6 kg)  05/01/16 127 lb 3.2 oz (57.7 kg)  06/02/15 127 lb 3.2 oz (57.7 kg)   Immunization History  Administered Date(s) Administered  . DTaP 05/15/2009  . Influenza Split 02/26/2013  . Influenza, High Dose Seasonal PF 03/03/2017  . Influenza,inj,Quad PF,6+ Mos 02/25/2015  . Influenza-Unspecified 02/26/2016  . Pneumococcal Conjugate-13 06/24/2013  . Pneumococcal Polysaccharide-23 05/06/2008, 03/23/2016  . Pneumococcal-Unspecified 06/25/2007  . Tdap 06/24/2008  . Zoster 07/10/2009   Health Maintenance  Topic Date Due  . MAMMOGRAM  02/22/2017  . INFLUENZA VACCINE  01/22/2018  . TETANUS/TDAP  05/16/2019  . COLONOSCOPY  12/17/2023  . DEXA SCAN  Completed  . Hepatitis C Screening  Completed  . PNA vac Low Risk Adult  Completed   QUIT DRIVING; SOLD CAR.  Cycling to [employer].   [employer]    Low dose CT for lung cancer for adults 55-80; smoked for 25 years.  Quit smoking 25 years ago or more.   Melanoma: L lower back; s/p resection by Dr. Leafy Kindle of general surgery at [place] age 73. Abdominal aortic aneurysm for 5-70 years old.  Osteoporosis: refuses bone density repeat; has researched heavily; refusing medication.  Loves milk, cheese.  Receives vitamin D supplement.  600-800IU daily.  Gets in sunshine.     Review of Systems  Constitutional: Negative for activity change, appetite change, chills, diaphoresis, fatigue, fever and unexpected weight change.  HENT: Negative for congestion, dental problem,  drooling, ear discharge, ear pain, facial swelling, hearing loss, mouth sores, nosebleeds, postnasal drip, rhinorrhea, sinus pressure, sneezing, sore throat, tinnitus, trouble swallowing and voice change.   Eyes: Negative for photophobia, pain, discharge, redness, itching and visual disturbance.  Respiratory: Negative for apnea, cough, choking, chest tightness, shortness of breath, wheezing and stridor.   Cardiovascular: Negative for chest pain, palpitations and leg swelling.  Gastrointestinal: Negative for abdominal distention, abdominal pain, anal bleeding, blood in stool, constipation, diarrhea, nausea, rectal pain and vomiting.  Endocrine: Negative for cold intolerance, heat intolerance, polydipsia, polyphagia and polyuria.  Genitourinary: Negative for decreased urine volume, difficulty urinating, dyspareunia, dysuria, enuresis, flank pain, frequency, genital sores, hematuria, menstrual problem, pelvic pain, urgency, vaginal bleeding, vaginal discharge and vaginal pain.       Nocturia x 1.  Leakage of urine none.  Musculoskeletal: Negative for arthralgias, back pain, gait problem, joint swelling, myalgias, neck pain and neck stiffness.  Skin: Negative for color change, pallor, rash and wound.  Allergic/Immunologic: Negative for environmental allergies, food allergies and immunocompromised state.  Neurological: Negative for dizziness, tremors, seizures, syncope, facial asymmetry, speech difficulty, weakness, light-headedness, numbness and headaches.  Hematological: Negative for adenopathy. Does not bruise/bleed easily.  Psychiatric/Behavioral: Negative for agitation, behavioral problems, confusion, decreased concentration, dysphoric mood, hallucinations, self-injury, sleep disturbance and suicidal ideas. The patient is not nervous/anxious and is not hyperactive.        Bedtime 530pm; wakes up at 200.    Past Medical History:  Diagnosis Date  .  Cataract   . Hyperlipidemia   . Melanoma of back  ([place]) 06/24/1994  . Osteoporosis    Past Surgical History:  Procedure Laterality Date  . MELANOMA EXCISION  06/24/1994   Allergies  Allergen Reactions  . Hydrocodone     rash   Current Outpatient Medications on File Prior to Visit  Medication Sig Dispense Refill  . aspirin EC 81 MG tablet Take 1 tablet (81 mg total) by mouth daily. 90 tablet 3   No current facility-administered medications on file prior to visit.    Social History   Socioeconomic History  . Marital status: Single    Spouse name: Not on file  . Number of children: Not on file  . Years of education: Not on file  . Highest education level: Not on file  Occupational History  . Not on file  Social Needs  . Financial resource strain: Not on file  . Food insecurity:    Worry: Not on file    Inability: Not on file  . Transportation needs:    Medical: Not on file    Non-medical: Not on file  Tobacco Use  . Smoking status: Never Smoker  . Smokeless tobacco: Never Used  Substance and Sexual Activity  . Alcohol use: No  . Drug use: No  . Sexual activity: Not Currently  Lifestyle  . Physical activity:    Days per week: Not on file    Minutes per session: Not on file  . Stress: Not on file  Relationships  . Social connections:    Talks on phone: Not on file    Gets together: Not on file    Attends religious service: Not on file    Active member of club or organization: Not on file    Attends meetings of clubs or organizations: Not on file    Relationship status: Not on file  . Intimate partner violence:    Fear of current or ex partner: Not on file    Emotionally abused: Not on file    Physically abused: Not on file    Forced sexual activity: Not on file  Other Topics Concern  . Not on file  Social History Narrative   Marital status: divorced since 25 years ago.  Not dating in 2019; not interested.       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      ADLs: independent with ADLs; no assistant devices.        Advanced Directives:  None; DNR.  HCPOA: none in 2019.         Family History  Problem Relation Age of Onset  . Cancer Mother 73       ovarian cancer  . Heart disease Father 56       CABG  . Hypercholesterolemia Sister   . Hypercholesterolemia Brother        Objective:    BP 140/80   Pulse 98   Temp 98 F (36.7 C) (Oral)   Resp 16   Ht 5' 6.14" (1.68 m)   Wt 127 lb (57.6 kg)   SpO2  98%   BMI 20.41 kg/m  Physical Exam  Constitutional: She is oriented to person, place, and time. She appears well-developed and well-nourished. No distress.  HENT:  Head: Normocephalic and atraumatic.  Right Ear: External ear normal.  Left Ear: External ear normal.  Nose: Nose normal.  Mouth/Throat: Oropharynx is clear and moist.  Eyes: Pupils are equal, round, and reactive to light. Conjunctivae and EOM are normal.  Neck: Normal range of motion and full passive range of motion without pain. Neck supple. No JVD present. Carotid bruit is not present. No thyromegaly present.  Cardiovascular: Normal rate, regular rhythm and normal heart sounds. Exam reveals no gallop and no friction rub.  No murmur heard. Pulmonary/Chest: Effort normal and breath sounds normal. She has no wheezes. She has no rales.  Abdominal: Soft. Bowel sounds are normal. She exhibits no distension and no mass. There is no tenderness. There is no rebound and no guarding.  Musculoskeletal:       Right shoulder: Normal.       Left shoulder: Normal.       Cervical back: Normal.  Lymphadenopathy:    She has no cervical adenopathy.  Neurological: She is alert and oriented to person, place, and time. She has normal reflexes. No cranial nerve deficit. She exhibits normal muscle tone.  Coordination normal.  Skin: Skin is warm and dry. No rash noted. She is not diaphoretic. No erythema. No pallor.  Psychiatric: She has a normal mood and affect. Her behavior is normal. Judgment and thought content normal.  Nursing note and vitals reviewed.  No results found. Depression screen [facility] 2/9 10/15/2017 05/01/2016 06/02/2015  Decreased Interest 0 0 0  Down, Depressed, Hopeless 0 0 0  PHQ - 2 Score 0 0 0   Fall Risk  10/15/2017 05/01/2016 06/02/2015  Falls in the past year? No No No        Assessment & Plan:   1. Routine physical examination   2. Osteopenia of multiple sites   3. Hypercholesterolemia   4. History of melanoma excision   5. History of tobacco abuse   6. Family history of ovarian cancer   7. Screening for diabetes mellitus   8. Breast cancer screening   9. Screening for AAA (abdominal aortic aneurysm)   10. Malignant neoplasm of head, face and neck ([place])     -anticipatory guidance provided --- exercise, weight loss, safe driving practices, aspirin 81mg  daily. -obtain age appropriate screening labs and labs for chronic disease management. -moderate fall risk; no evidence of depression; no evidence of hearing loss.  Discussed advanced directives and living will; also discussed end of life issues including code status.  -osteopenia: patient refuses therapy; recommend calcium 600mg  twice daily and vitamin D [phone] IU daily; recommend weight bearing exercise daily for 30 minutes. -refer for mammogram. -patient desires AAA screening: refer for US aorta. -tobacco abuse hx: obtain CT chest low dose. -history of melanoma: highly recommend yearly dermatology evaluation.  Referral placed.    Orders Placed This Encounter  Procedures  . MM DIGITAL SCREENING BILATERAL    Standing Status:   Future    Standing Expiration Date:   12/16/2018    Order Specific Question:   Reason for Exam (SYMPTOM  OR DIAGNOSIS REQUIRED)    Answer:   screening for breast cancer    Order  Specific Question:   Preferred imaging location?    Answer:   [facility]  . US Aorta    Standing Status:   Future  Standing Expiration Date:   12/16/2018    Order Specific Question:   Reason for Exam (SYMPTOM  OR DIAGNOSIS REQUIRED)    Answer:   screening ultrasound for aneurysm in previous smoker    Order Specific Question:   Preferred imaging location?    Answer:   [facility]  . CT CHEST WO/CM SCREENING    Standing Status:   Future    Standing Expiration Date:   12/16/2018    Order Specific Question:   Preferred imaging location?    Answer:   [facility]  . CBC with Differential/Platelet  . Comprehensive metabolic panel    Order Specific Question:   Has the patient fasted?    Answer:   No  . Hemoglobin A1c  . Lipid panel    Order Specific Question:   Has the patient fasted?    Answer:   No  . TSH  . VITAMIN D 25 Hydroxy (Vit-D Deficiency, Fractures)  . Ambulatory referral to Dermatology    Referral Priority:   Routine    Referral Type:   Consultation    Referral Reason:   Specialty Services Required    Requested Specialty:   Dermatology    Number of Visits Requested:   1  . POCT urinalysis dipstick   No orders of the defined types were placed in this encounter.   No follow-ups on file.   Asiya Cutbirth Elayne Guerin, M.D. Primary Care at [facility] previously [facility] [place] [address] [place], [place]  [phone] phone [phone] fax

## 2017-10-16 LAB — COMPREHENSIVE METABOLIC PANEL
A/G RATIO: 2.1 (ref 1.2–2.2)
ALBUMIN: 4.5 g/dL (ref 3.6–4.8)
ALK PHOS: 63 IU/L (ref 39–117)
ALT: 21 IU/L (ref 0–32)
AST: 26 IU/L (ref 0–40)
BILIRUBIN TOTAL: 0.4 mg/dL (ref 0.0–1.2)
BUN / CREAT RATIO: 20 (ref 12–28)
BUN: 15 mg/dL (ref 8–27)
CHLORIDE: 99 mmol/L (ref 96–106)
CO2: 21 mmol/L (ref 20–29)
Calcium: 9.2 mg/dL (ref 8.7–10.3)
Creatinine, Ser: 0.76 mg/dL (ref 0.57–1.00)
GFR calc non Af Amer: 80 mL/min/{1.73_m2} (ref >59)
GFR, EST AFRICAN AMERICAN: 93 mL/min/{1.73_m2} (ref >59)
GLOBULIN, TOTAL: 2.1 g/dL (ref 1.5–4.5)
Glucose: 95 mg/dL (ref 65–99)
Potassium: 4.3 mmol/L (ref 3.5–5.2)
SODIUM: 136 mmol/L (ref 134–144)
Total Protein: 6.6 g/dL (ref 6.0–8.5)

## 2017-10-16 LAB — CBC WITH DIFFERENTIAL/PLATELET
BASOS ABS: 0 10*3/uL (ref 0.0–0.2)
BASOS: 1 %
EOS (ABSOLUTE): 0 10*3/uL (ref 0.0–0.4)
EOS: 1 %
HEMATOCRIT: 40.8 % (ref 34.0–46.6)
HEMOGLOBIN: 13.6 g/dL (ref 11.1–15.9)
Immature Grans (Abs): 0 10*3/uL (ref 0.0–0.1)
Immature Granulocytes: 0 %
Lymphocytes Absolute: 1.6 10*3/uL (ref 0.7–3.1)
Lymphs: 36 %
MCH: 29.7 pg (ref 26.6–33.0)
MCHC: 33.3 g/dL (ref 31.5–35.7)
MCV: 89 fL (ref 79–97)
MONOCYTES: 8 %
Monocytes Absolute: 0.3 10*3/uL (ref 0.1–0.9)
NEUTROS ABS: 2.4 10*3/uL (ref 1.4–7.0)
Neutrophils: 54 %
Platelets: 276 10*3/uL (ref 150–379)
RBC: 4.58 x10E6/uL (ref 3.77–5.28)
RDW: 13 % (ref 12.3–15.4)
WBC: 4.4 10*3/uL (ref 3.4–10.8)

## 2017-10-16 LAB — LIPID PANEL
CHOL/HDL RATIO: 2.8 ratio (ref 0.0–4.4)
Cholesterol, Total: 231 mg/dL — ABNORMAL HIGH (ref 100–199)
HDL: 83 mg/dL (ref >39)
LDL CALC: 136 mg/dL — AB (ref 0–99)
Triglycerides: 59 mg/dL (ref 0–149)
VLDL Cholesterol Cal: 12 mg/dL (ref 5–40)

## 2017-10-16 LAB — VITAMIN D 25 HYDROXY (VIT D DEFICIENCY, FRACTURES): VIT D 25 HYDROXY: 31.9 ng/mL (ref 30.0–100.0)

## 2017-10-16 LAB — HEMOGLOBIN A1C
Est. average glucose Bld gHb Est-mCnc: 114 mg/dL
HEMOGLOBIN A1C: 5.6 % (ref 4.8–5.6)

## 2017-10-16 LAB — TSH: TSH: 2.23 u[IU]/mL (ref 0.450–4.500)

## 2017-10-22 ENCOUNTER — Encounter: Payer: Self-pay | Admitting: Family Medicine

## 2017-10-24 ENCOUNTER — Other Ambulatory Visit: Payer: Self-pay | Admitting: Family Medicine

## 2017-11-12 ENCOUNTER — Ambulatory Visit
Admission: RE | Admit: 2017-11-12 | Discharge: 2017-11-12 | Disposition: A | Discharge status: Home / Self Care | Payer: Federal, State, Local not specified - PPO | Source: Ambulatory Visit | Attending: Family Medicine | Admitting: Family Medicine

## 2017-11-12 DIAGNOSIS — Z136 Encounter for screening for cardiovascular disorders: Secondary | ICD-10-CM

## 2017-11-18 ENCOUNTER — Encounter: Payer: Self-pay | Admitting: Family Medicine

## 2017-12-03 ENCOUNTER — Telehealth: Payer: Self-pay | Admitting: Family Medicine

## 2017-12-03 NOTE — Telephone Encounter (Signed)
Pt is wanting a prescription for Vitamin D. She was told by her pharmacy that if she got a prescription for it, it would be free due to her age.

## 2017-12-05 NOTE — Telephone Encounter (Signed)
Call --- patient's vitamin D level is normal thus she does not warrant a prescription dose of vitamin D.  A prescription is only warranted if patient had a vitamin D deficiency and she does not.

## 2017-12-05 NOTE — Telephone Encounter (Signed)
DPR reviewed - patient prefers Mychart message. Mychart message with reply from Dr. Tamala Julian sent to patient.

## 2018-01-25 ENCOUNTER — Other Ambulatory Visit: Payer: Self-pay | Admitting: Family Medicine

## 2018-01-25 ENCOUNTER — Encounter: Payer: Self-pay | Admitting: Family Medicine

## 2018-01-25 DIAGNOSIS — R3129 Other microscopic hematuria: Secondary | ICD-10-CM

## 2018-02-25 ENCOUNTER — Encounter: Payer: Self-pay | Admitting: Family Medicine

## 2018-04-17 ENCOUNTER — Telehealth: Payer: Self-pay

## 2018-04-17 NOTE — Telephone Encounter (Signed)
Rx request for pt she would like 800 IU Tablet of Vit D sent to her pharmacy. Pt states she would like to reduce her dietary intake of dairy ( she dont eat red meat) so she feels she will lose some Vit D.  [facility] pharmacy location [facility].

## 2018-08-17 ENCOUNTER — Telehealth: Payer: Self-pay | Admitting: Family Medicine

## 2018-08-17 NOTE — Telephone Encounter (Signed)
Called pt and cancelled appt due to Dr. Pamella Pert being out of the office. Pt states that she will call in and reschedule at a later time.

## 2018-10-20 ENCOUNTER — Encounter: Payer: Federal, State, Local not specified - PPO | Admitting: Family Medicine

## 2019-02-17 ENCOUNTER — Other Ambulatory Visit: Payer: Self-pay | Admitting: Family Medicine

## 2019-02-17 NOTE — Telephone Encounter (Signed)
Requested medication (s) are due for refill today: yes  Requested medication (s) are on the active medication list: yes  Last refill:  10/24/2017  Future visit scheduled: no  Notes to clinic Review for refill Tried to contact patient to schedule appointment but call wouldn't go through   Requested Prescriptions  Pending Prescriptions Disp Refills   aspirin 81 MG EC tablet [[facility] ASPIRIN EC 81 MG TABLET] 90 tablet 3    Sig: TAKE 1 TABLET BY MOUTH EVERY DAY     Analgesics:  NSAIDS - aspirin Failed - 02/17/2019  9:08 AM      Failed - Valid encounter within last 12 months    Recent Outpatient Visits          1 year ago Routine physical examination   Primary Care at [facility], Renette Butters, MD   2 years ago Routine physical examination   Primary Care at [facility], Renette Butters, MD   3 years ago Routine physical examination   Primary Care at [facility], Renette Butters, MD   7 years ago Screening colonoscopy   [facility] at [facility], Maebelle Munroe, MD             Passed - Patient is not pregnant

## 2019-03-01 IMAGING — US US ABDOMINAL AORTA SCREENING AAA
1 series · 14 of 16 positions shown · non-contrast
Comparison: None.

CLINICAL DATA: Previous tobacco abuse.

EXAM:
US ABDOMINAL AORTA MEDICARE SCREENING
TECHNIQUE: Ultrasound examination of the abdominal aorta was performed as a
screening evaluation for abdominal aortic aneurysm.

[Series 1: us abdominal aorta screening aaa · 0.20mm/px · 14 of 16 slices shown]
[im 1/16]
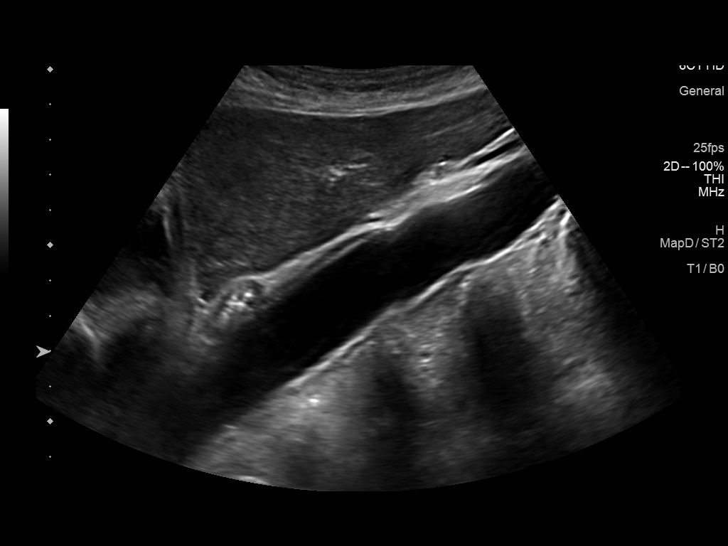
[im 2/16]
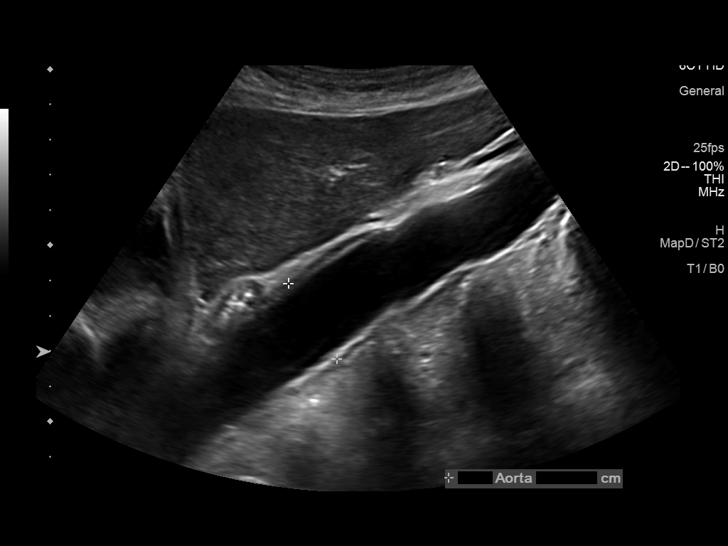
[im 3/16]
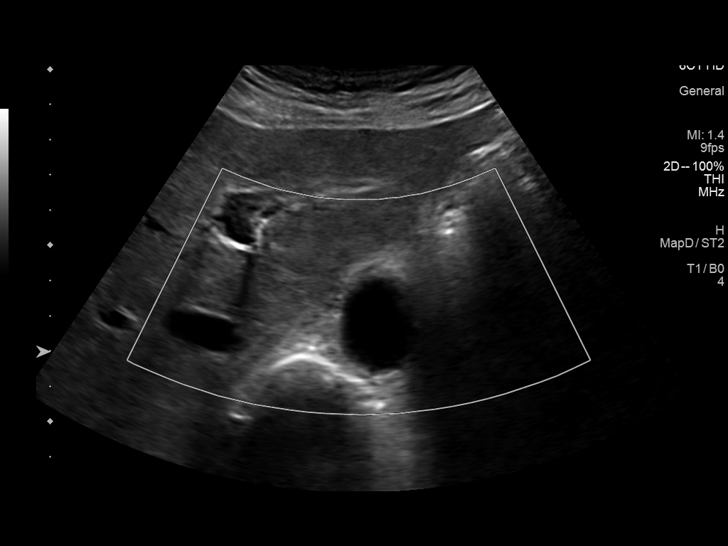
[im 5/16]
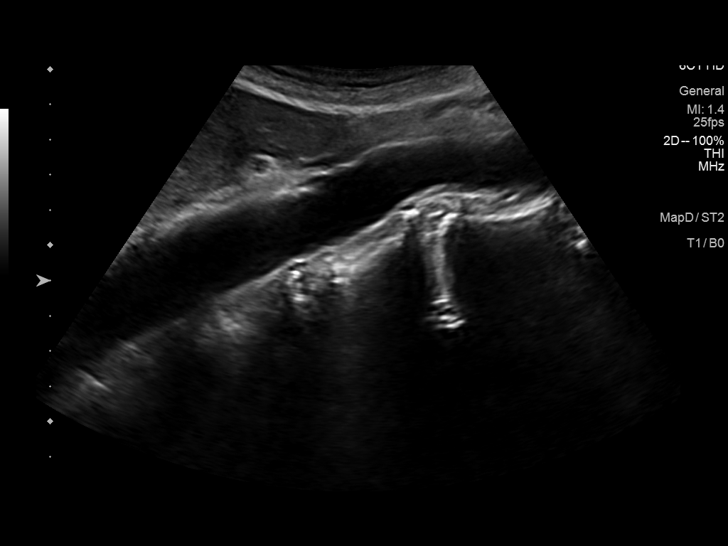
[im 6/16]
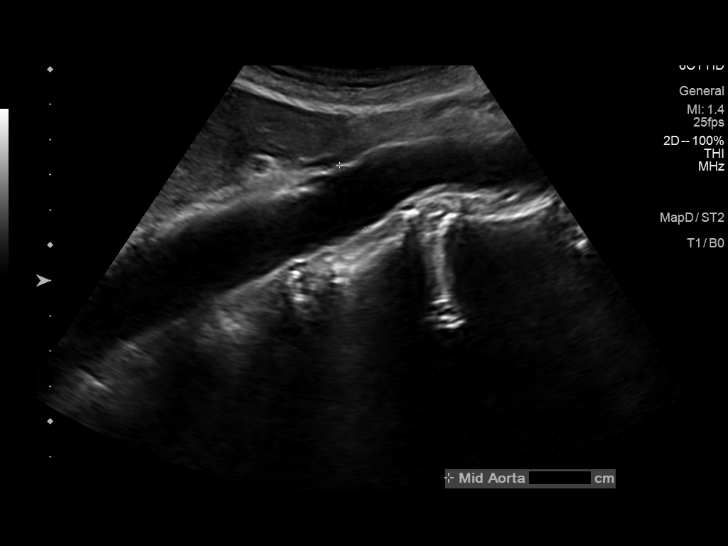
[im 7/16]
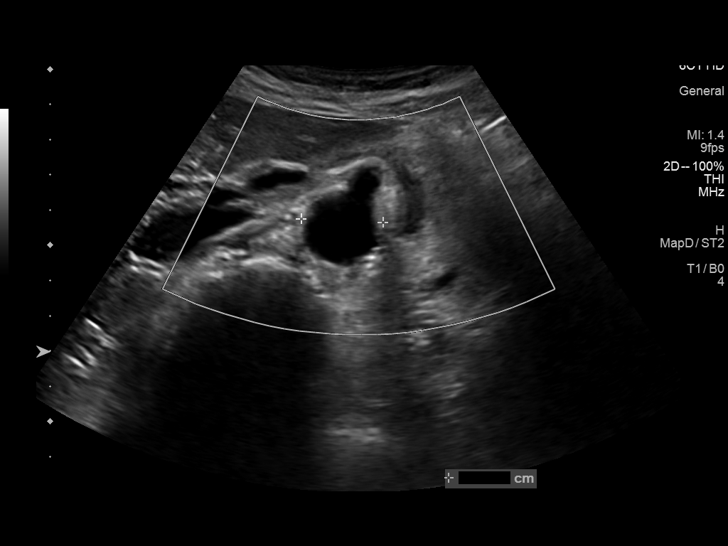
[im 8/16]
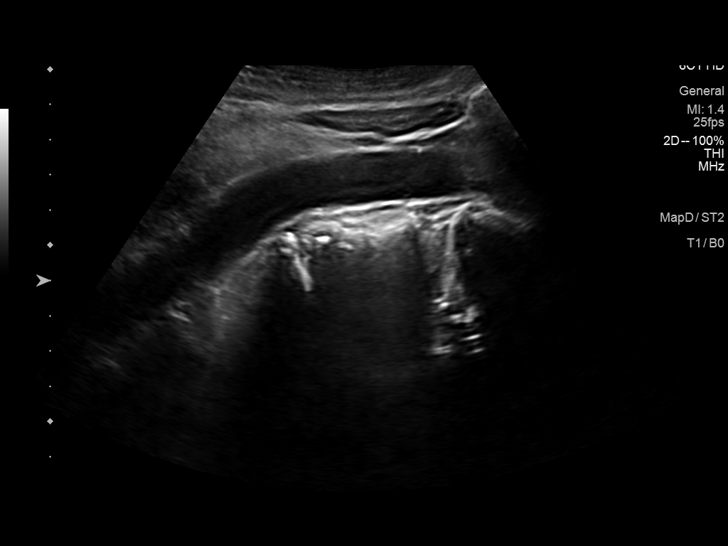
[im 9/16]
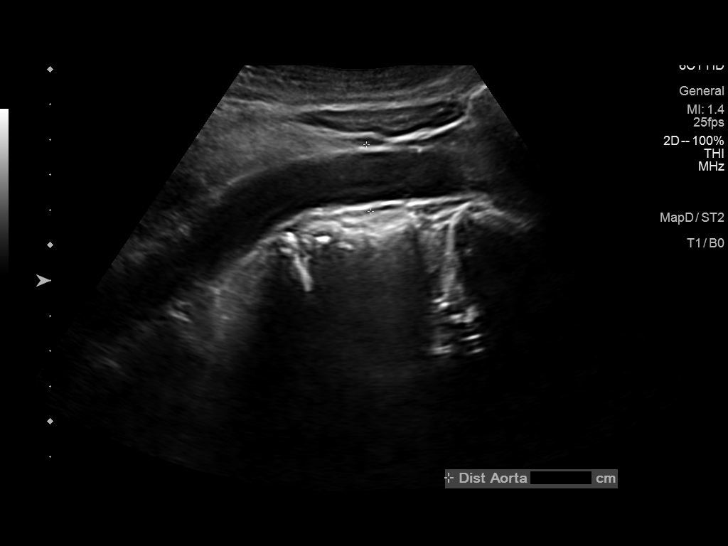
[im 10/16]
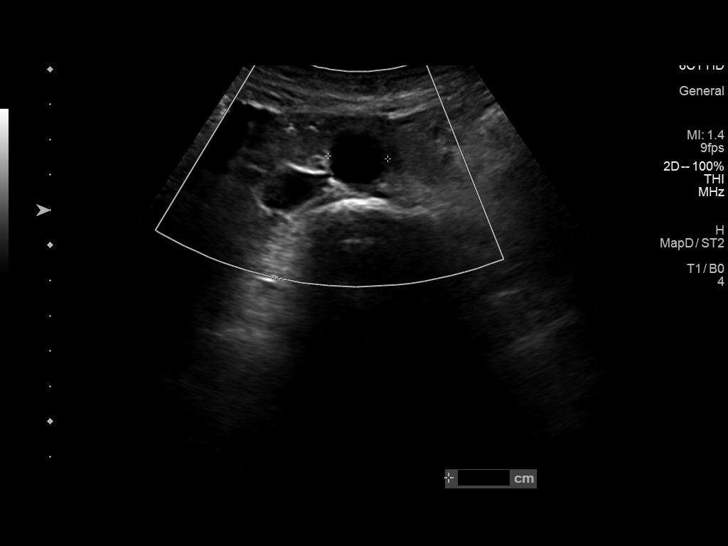
[im 11/16]
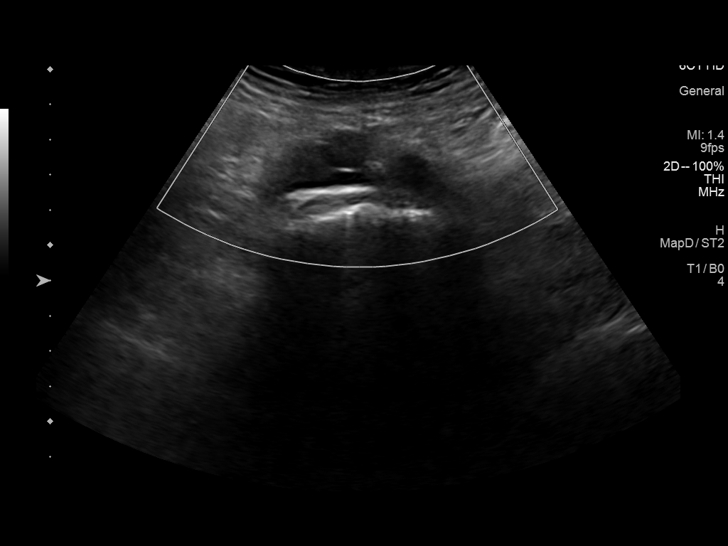
[im 13/16]
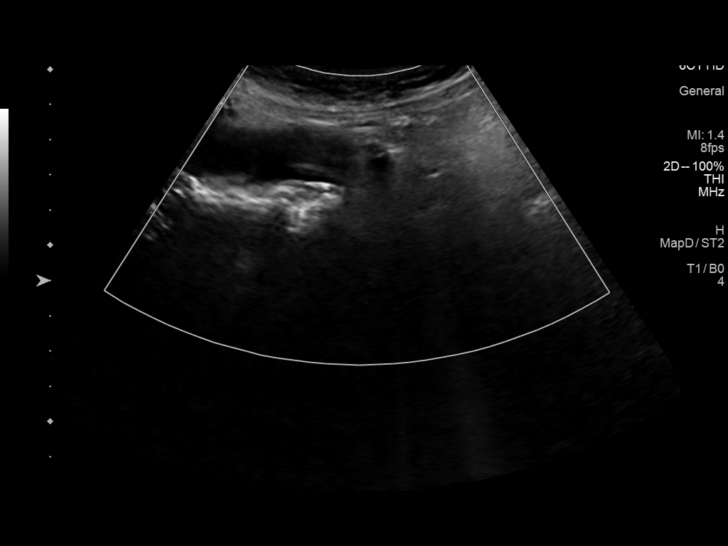
[im 14/16]
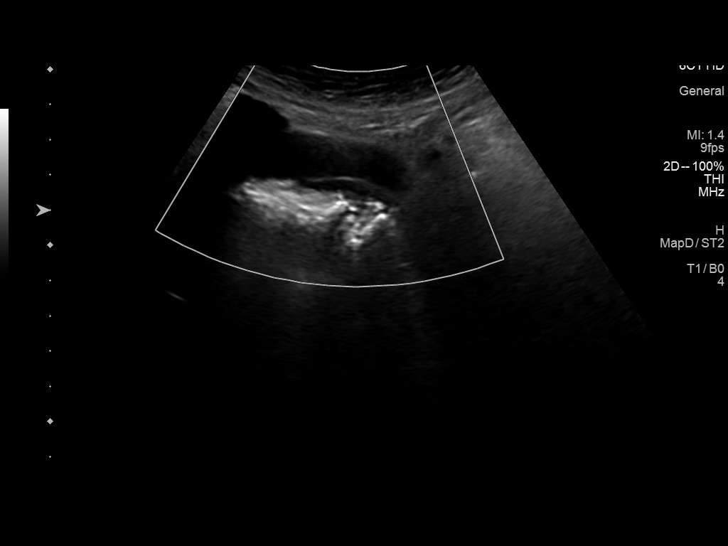
[im 15/16]
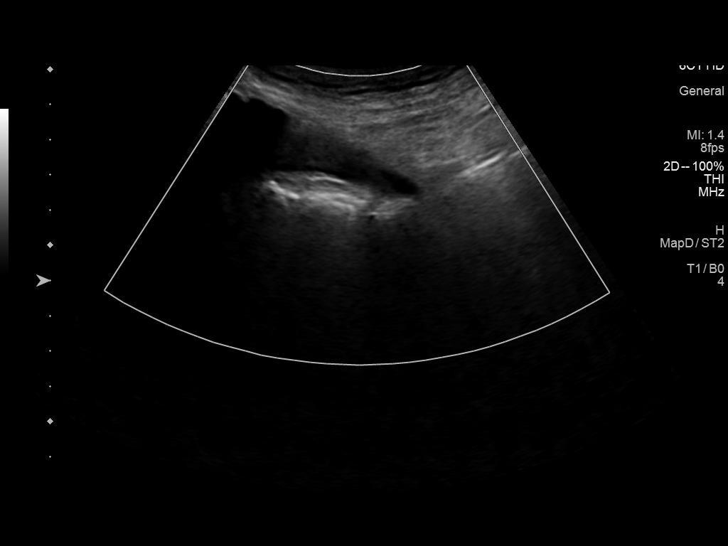
[im 16/16]
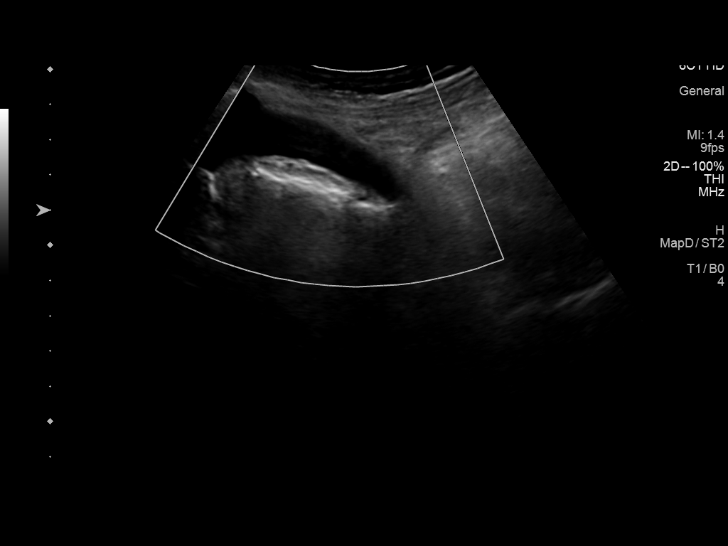

[14 of 16 positions shown; findings below may reference images not displayed]

FINDINGS: Abdominal aortic measurements as follows:

Proximal:  2.5 x 2.3 cm

Mid:  2 x 2.3 cm

Distal:  1.9 x 1.7 cm

The visualized proximal common iliac arteries normal in caliber.
IMPRESSION: Ectatic abdominal aorta at risk for aneurysm development. Recommend
followup by ultrasound in 5 years. This recommendation follows ACR
consensus guidelines: White Paper of the ACR Incidental Findings
Committee II on Vascular Findings. [HOSPITAL] 3459;

## 2019-04-01 ENCOUNTER — Other Ambulatory Visit: Payer: Self-pay | Admitting: Family Medicine

## 2019-04-01 NOTE — Telephone Encounter (Unsigned)
Copied from [place] [id number]. Topic: Quick Communication - Rx Refill/Question >> Apr 01, 2019  9:48 AM Yvette Rack wrote: Medication: aspirin 81 MG EC tablet  Has the patient contacted their pharmacy? yes   Preferred Pharmacy (with phone number or street name[id number] [place], [place] [phone] (Phone)  [phone] (Fax)  Agent: Please be advised that RX refills may take up to 3 business days. We ask that you follow-up with your pharmacy.

## 2019-04-01 NOTE — Telephone Encounter (Signed)
Requested medication (s) are due for refill today: yes  Requested medication (s) are on the active medication list: yes  Last refill:  11/03/2017  Future visit scheduled: no  Notes to clinic:  Review for refill   Requested Prescriptions  Pending Prescriptions Disp Refills   aspirin 81 MG EC tablet 90 tablet 3    Sig: TAKE 1 TABLET (81 MG TOTAL) BY MOUTH DAILY.     Analgesics:  NSAIDS - aspirin Failed - 04/01/2019  9:52 AM      Failed - Valid encounter within last 12 months    Recent Outpatient Visits          1 year ago Routine physical examination   Primary Care at [facility], Renette Butters, MD   2 years ago Routine physical examination   Primary Care at [facility], Renette Butters, MD   3 years ago Routine physical examination   Primary Care at [facility], Renette Butters, MD   7 years ago Screening colonoscopy   [facility] at [facility], Maebelle Munroe, MD             Passed - Patient is not pregnant

## 2019-04-10 ENCOUNTER — Other Ambulatory Visit: Payer: Self-pay | Admitting: Family Medicine

## 2019-04-10 NOTE — Telephone Encounter (Signed)
Requested medication (s) are due for refill today: yes  Requested medication (s) are on the active medication list: yes  Last refill: 10/02/2018  Future visit scheduled:no  Notes to clinic:  Last visit 10/16/2018 with Reginia Forts    Requested Prescriptions  Pending Prescriptions Disp Refills   aspirin 81 MG EC tablet [[facility] ASP[facility] 81 MG TABLET] 90 tablet 3    Sig: TAKE 1 TABLET BY MOUTH EVERY DAY     Analgesics:  NSAIDS - aspirin Failed - 04/10/2019 10:02 AM      Failed - Valid encounter within last 12 months    Recent Outpatient Visits          1 year ago Routine physical examination   Primary Care at [facility], Renette Butters, MD   2 years ago Routine physical examination   Primary Care at [facility], Renette Butters, MD   3 years ago Routine physical examination   Primary Care at [facility], Renette Butters, MD   7 years ago Screening colonoscopy   [facility] at [facility], Maebelle Munroe, MD             Passed - Patient is not pregnant

## 2019-04-12 ENCOUNTER — Telehealth: Payer: Self-pay | Admitting: *Deleted

## 2019-04-12 NOTE — Telephone Encounter (Signed)
Patient will need an appointment for refills.  Last seen by Dr. Tamala Julian 10-2017

## 2019-04-12 NOTE — Telephone Encounter (Signed)
Pt refused appt

## 2019-08-20 ENCOUNTER — Ambulatory Visit: Payer: Federal, State, Local not specified - PPO | Attending: Internal Medicine

## 2019-08-20 DIAGNOSIS — Z23 Encounter for immunization: Secondary | ICD-10-CM | POA: Insufficient documentation

## 2019-08-20 NOTE — Progress Notes (Signed)
   Covid-19 Vaccination Clinic  Name:  Lauren Downs    MRN: [id number] DOB: 1946-05-23  08/20/2019  [name] was observed post Covid-19 immunization for 15 minutes without incidence. She was provided with Vaccine Information Sheet and instruction to access the V-Safe system.   [name] was instructed to call 911 with any severe reactions post vaccine: Marland Kitchen Difficulty breathing  . Swelling of your face and throat  . A fast heartbeat  . A bad rash all over your body  . Dizziness and weakness    Immunizations Administered    Name Date Dose VIS Date Route   Pfizer COVID-19 Vaccine 08/20/2019  8:38 AM 0.3 mL 06/04/2019 Intramuscular   Manufacturer: [place]   Lot: [id number]   [place]: [id number]

## 2019-09-14 ENCOUNTER — Ambulatory Visit: Payer: Federal, State, Local not specified - PPO | Attending: Internal Medicine

## 2019-09-14 DIAGNOSIS — Z23 Encounter for immunization: Secondary | ICD-10-CM

## 2019-09-14 NOTE — Progress Notes (Signed)
   Covid-19 Vaccination Clinic  Name:  Lauren Downs    MRN: [id number] DOB: Jul 09, 1945  09/14/2019  [name] was observed post Covid-19 immunization for 15 minutes without incident. She was provided with Vaccine Information Sheet and instruction to access the V-Safe system.   [name] was instructed to call 911 with any severe reactions post vaccine: Marland Kitchen Difficulty breathing  . Swelling of face and throat  . A fast heartbeat  . A bad rash all over body  . Dizziness and weakness   Immunizations Administered    Name Date Dose VIS Date Route   Pfizer COVID-19 Vaccine 09/14/2019 10:15 AM 0.3 mL 06/04/2019 Intramuscular   Manufacturer: [place]   Lot: [id number]   [place]: [id number]

## 2020-04-22 ENCOUNTER — Ambulatory Visit: Payer: Federal, State, Local not specified - PPO | Attending: Internal Medicine

## 2020-04-22 DIAGNOSIS — Z23 Encounter for immunization: Secondary | ICD-10-CM

## 2020-04-22 NOTE — Progress Notes (Signed)
   Covid-19 Vaccination Clinic  Name:  Lauren Downs    MRN: [id number] DOB: 18-Oct-1945  04/22/2020  [name] was observed post Covid-19 immunization for 15 minutes without incident. She was provided with Vaccine Information Sheet and instruction to access the V-Safe system.   [name] was instructed to call 911 with any severe reactions post vaccine: Marland Kitchen Difficulty breathing  . Swelling of face and throat  . A fast heartbeat  . A bad rash all over body  . Dizziness and weakness

## 2023-09-13 ENCOUNTER — Other Ambulatory Visit: Payer: Self-pay

## 2023-09-13 ENCOUNTER — Emergency Department (HOSPITAL_COMMUNITY)
Admission: EM | Admit: 2023-09-13 | Discharge: 2023-09-13 | Disposition: A | Discharge status: Home / Self Care | Attending: Emergency Medicine | Admitting: Emergency Medicine

## 2023-09-13 ENCOUNTER — Encounter (HOSPITAL_COMMUNITY): Payer: Self-pay

## 2023-09-13 ENCOUNTER — Emergency Department (HOSPITAL_COMMUNITY)

## 2023-09-13 DIAGNOSIS — Z7982 Long term (current) use of aspirin: Secondary | ICD-10-CM | POA: Diagnosis not present

## 2023-09-13 DIAGNOSIS — M25562 Pain in left knee: Secondary | ICD-10-CM | POA: Insufficient documentation

## 2023-09-13 NOTE — ED Provider Notes (Signed)
 [place] EMERGENCY DEPARTMENT AT [facility] Provider Note   CSN: [id number] Arrival date & time: 09/13/23  1230     History  Chief Complaint  Patient presents with   Knee Pain    Lauren Downs is a 78 y.o. female.  This is 78 year old female presents with left-sided knee pain which began when she was walking the steps.  Felt a pop in her knee as she took a step.  No prior history of knee issues.  Pain is characterized as sharp and worse with standing and better without walking.  No distal numbness or tingling to her left foot.  No treatment used prior to arrival       Home Medications Prior to Admission medications   Medication Sig Start Date End Date Taking? Authorizing Provider  aspirin 81 MG EC tablet TAKE 1 TABLET (81 MG TOTAL) BY MOUTH DAILY. 10/24/17   Ethelda Chick, MD      Allergies    Hydrocodone    Review of Systems   Review of Systems  All other systems reviewed and are negative.   Physical Exam Updated Vital Signs BP (!) 142/95 (BP Location: Right Arm)   Pulse (!) 56   Temp 98.3 F (36.8 C) (Oral)   Resp 16   Ht 1.676 m (5\' 6" )   Wt 57.6 kg   SpO2 100%   BMI 20.50 kg/m  Physical Exam Vitals and nursing note reviewed.  Constitutional:      General: She is not in acute distress.    Appearance: Normal appearance. She is well-developed. She is not toxic-appearing.  HENT:     Head: Normocephalic and atraumatic.  Eyes:     General: Lids are normal.     Conjunctiva/sclera: Conjunctivae normal.     Pupils: Pupils are equal, round, and reactive to light.  Neck:     Thyroid: No thyroid mass.     Trachea: No tracheal deviation.  Cardiovascular:     Rate and Rhythm: Normal rate and regular rhythm.     Heart sounds: Normal heart sounds. No murmur heard.    No gallop.  Pulmonary:     Effort: Pulmonary effort is normal. No respiratory distress.     Breath sounds: Normal breath sounds. No stridor. No decreased breath sounds, wheezing,  rhonchi or rales.  Abdominal:     General: There is no distension.     Palpations: Abdomen is soft.     Tenderness: There is no abdominal tenderness. There is no rebound.  Musculoskeletal:     Cervical back: Normal range of motion and neck supple.     Left knee: Decreased range of motion. Tenderness present.     Comments: Neurovasc intact at left foot  Skin:    General: Skin is warm and dry.     Findings: No abrasion or rash.  Neurological:     Mental Status: She is alert and oriented to person, place, and time. Mental status is at baseline.     GCS: GCS eye subscore is 4. GCS verbal subscore is 5. GCS motor subscore is 6.     Cranial Nerves: No cranial nerve deficit.     Sensory: No sensory deficit.     Motor: Motor function is intact.  Psychiatric:        Attention and Perception: Attention normal.        Speech: Speech normal.        Behavior: Behavior normal.     ED Results /  Procedures / Treatments   Labs (all labs ordered are listed, but only abnormal results are displayed) Labs Reviewed - No data to display  EKG None  Radiology No results found.  Procedures Procedures    Medications Ordered in ED Medications - No data to display  ED Course/ Medical Decision Making/ A&P                                 Medical Decision Making Amount and/or Complexity of Data Reviewed Radiology: ordered.   X-ray of the knee without evidence of fracture.  SPECT ligamentous injury.  Patient be placed in knee immobilizer given crutches as well as orthopedic referral.  Patient agreeable to this        Final Clinical Impression(s) / ED Diagnoses Final diagnoses:  None    Rx / DC Orders ED Discharge Orders     None         Lorre Nick, MD 09/13/23 1408

## 2023-09-13 NOTE — Progress Notes (Signed)
 Orthopedic Tech Progress Note Patient Details:  SANAII CAPORASO 03-29-1946 [id number]  Ortho Devices Type of Ortho Device: Knee Immobilizer, Crutches Ortho Device/Splint Location: LLE Ortho Device/Splint Interventions: Ordered, Application, Adjustment   Post Interventions Patient Tolerated: Well Instructions Provided: Care of device, Poper ambulation with device  Tonye Pearson 09/13/2023, 3:39 PM

## 2023-09-13 NOTE — Discharge Instructions (Signed)
 The x-ray of your knee today did not show any evidence of broken bones.  Follow-up with the orthopedist

## 2023-09-13 NOTE — ED Triage Notes (Signed)
 Pt was walking up her stairs an hr ago and heard/felt a pop behind her left knee. Pt unable to put pressure on that knee.
